# Patient Record
Sex: Male | Born: 1965 | Race: Black or African American | Hispanic: No | Smoking: Former smoker
Health system: Southern US, Community
[De-identification: ages and names within clinical notes are randomized; demographics above are authoritative.]

## PROBLEM LIST (undated history)

## (undated) DIAGNOSIS — Z720 Tobacco use: Secondary | ICD-10-CM

## (undated) DIAGNOSIS — F1290 Cannabis use, unspecified, uncomplicated: Secondary | ICD-10-CM

## (undated) DIAGNOSIS — E785 Hyperlipidemia, unspecified: Secondary | ICD-10-CM

## (undated) DIAGNOSIS — I2510 Atherosclerotic heart disease of native coronary artery without angina pectoris: Secondary | ICD-10-CM

## (undated) DIAGNOSIS — W3400XA Accidental discharge from unspecified firearms or gun, initial encounter: Secondary | ICD-10-CM

## (undated) DIAGNOSIS — I10 Essential (primary) hypertension: Secondary | ICD-10-CM

## (undated) DIAGNOSIS — Y249XXA Unspecified firearm discharge, undetermined intent, initial encounter: Secondary | ICD-10-CM

## (undated) DIAGNOSIS — R569 Unspecified convulsions: Secondary | ICD-10-CM

## (undated) HISTORY — PX: OTHER SURGICAL HISTORY: SHX169

## (undated) HISTORY — PX: WISDOM TOOTH EXTRACTION: SHX21

---

## 1998-01-31 ENCOUNTER — Encounter: Payer: Self-pay | Admitting: Emergency Medicine

## 1998-01-31 ENCOUNTER — Emergency Department (HOSPITAL_COMMUNITY): Admission: EM | Admit: 1998-01-31 | Discharge: 1998-01-31 | Payer: Self-pay | Admitting: Emergency Medicine

## 2000-06-29 ENCOUNTER — Emergency Department (HOSPITAL_COMMUNITY): Admission: EM | Admit: 2000-06-29 | Discharge: 2000-06-29 | Payer: Self-pay | Admitting: Emergency Medicine

## 2000-06-29 ENCOUNTER — Encounter: Payer: Self-pay | Admitting: Emergency Medicine

## 2000-08-18 ENCOUNTER — Emergency Department (HOSPITAL_COMMUNITY): Admission: EM | Admit: 2000-08-18 | Discharge: 2000-08-18 | Payer: Self-pay | Admitting: *Deleted

## 2004-11-06 ENCOUNTER — Emergency Department (HOSPITAL_COMMUNITY): Admission: EM | Admit: 2004-11-06 | Discharge: 2004-11-06 | Payer: Self-pay | Admitting: Emergency Medicine

## 2005-08-29 ENCOUNTER — Emergency Department (HOSPITAL_COMMUNITY): Admission: EM | Admit: 2005-08-29 | Discharge: 2005-08-30 | Payer: Self-pay | Admitting: Emergency Medicine

## 2005-08-31 ENCOUNTER — Emergency Department (HOSPITAL_COMMUNITY): Admission: EM | Admit: 2005-08-31 | Discharge: 2005-08-31 | Payer: Self-pay | Admitting: Emergency Medicine

## 2008-01-11 ENCOUNTER — Ambulatory Visit: Payer: Self-pay | Admitting: Cardiothoracic Surgery

## 2008-01-11 ENCOUNTER — Inpatient Hospital Stay (HOSPITAL_COMMUNITY): Admission: AC | Admit: 2008-01-11 | Discharge: 2008-01-13 | Payer: Self-pay

## 2008-01-12 ENCOUNTER — Ambulatory Visit: Payer: Self-pay | Admitting: Cardiovascular Disease

## 2008-01-12 ENCOUNTER — Encounter: Payer: Self-pay | Admitting: Cardiothoracic Surgery

## 2008-03-21 ENCOUNTER — Ambulatory Visit: Payer: Self-pay | Admitting: Cardiothoracic Surgery

## 2008-03-21 ENCOUNTER — Encounter: Admission: RE | Admit: 2008-03-21 | Discharge: 2008-03-21 | Payer: Self-pay | Admitting: Cardiothoracic Surgery

## 2010-05-05 ENCOUNTER — Encounter: Payer: Self-pay | Admitting: Cardiothoracic Surgery

## 2010-08-08 DIAGNOSIS — Z0271 Encounter for disability determination: Secondary | ICD-10-CM
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
## 2011-01-13 LAB — TYPE AND SCREEN
ABO/RH(D): B POS
Antibody Screen: NEGATIVE

## 2011-01-13 LAB — BASIC METABOLIC PANEL
BUN: 5 — ABNORMAL LOW
CO2: 25
Calcium: 8.9
Chloride: 107
Creatinine, Ser: 0.88
GFR calc Af Amer: >60
GFR calc non Af Amer: >60
Glucose, Bld: 120 — ABNORMAL HIGH
Potassium: 3.8
Sodium: 139

## 2011-01-13 LAB — CBC
HCT: 36.5 — ABNORMAL LOW
HCT: 36.5 — ABNORMAL LOW
Hemoglobin: 12.6 — ABNORMAL LOW
Hemoglobin: 12.7 — ABNORMAL LOW
MCHC: 34.5
MCHC: 34.7
MCV: 88.3
MCV: 88.4
Platelets: 123 — ABNORMAL LOW
RBC: 4.13 — ABNORMAL LOW
RBC: 4.13 — ABNORMAL LOW
RDW: 13.1
RDW: 13.2
WBC: 13.5 — ABNORMAL HIGH
WBC: 14.9 — ABNORMAL HIGH

## 2011-01-13 LAB — ABO/RH: ABO/RH(D): B POS

## 2011-01-13 LAB — POCT I-STAT, CHEM 8
BUN: 9
Calcium, Ion: 1.14
Chloride: 107
Creatinine, Ser: 1.1
Glucose, Bld: 98
HCT: 37 — ABNORMAL LOW
Hemoglobin: 12.6 — ABNORMAL LOW
Potassium: 3.5
Sodium: 139
TCO2: 22

## 2011-01-13 LAB — CARDIAC PANEL(CRET KIN+CKTOT+MB+TROPI)
CK, MB: 3.7
Relative Index: 0.5
Total CK: 769 — ABNORMAL HIGH
Troponin I: 0.01

## 2011-01-13 LAB — PROTIME-INR
INR: 1
Prothrombin Time: 13.7

## 2013-12-28 ENCOUNTER — Emergency Department (HOSPITAL_COMMUNITY): Payer: Self-pay

## 2013-12-28 ENCOUNTER — Emergency Department (HOSPITAL_COMMUNITY)
Admission: EM | Admit: 2013-12-28 | Discharge: 2013-12-28 | Disposition: A | Discharge status: Home / Self Care | Payer: Self-pay | Attending: Emergency Medicine | Admitting: Emergency Medicine

## 2013-12-28 ENCOUNTER — Encounter (HOSPITAL_COMMUNITY): Payer: Self-pay | Admitting: Emergency Medicine

## 2013-12-28 DIAGNOSIS — R071 Chest pain on breathing: Secondary | ICD-10-CM | POA: Insufficient documentation

## 2013-12-28 DIAGNOSIS — I10 Essential (primary) hypertension: Secondary | ICD-10-CM | POA: Insufficient documentation

## 2013-12-28 DIAGNOSIS — R079 Chest pain, unspecified: Secondary | ICD-10-CM | POA: Insufficient documentation

## 2013-12-28 DIAGNOSIS — F17200 Nicotine dependence, unspecified, uncomplicated: Secondary | ICD-10-CM | POA: Insufficient documentation

## 2013-12-28 DIAGNOSIS — Z791 Long term (current) use of non-steroidal anti-inflammatories (NSAID): Secondary | ICD-10-CM | POA: Insufficient documentation

## 2013-12-28 DIAGNOSIS — R209 Unspecified disturbances of skin sensation: Secondary | ICD-10-CM | POA: Insufficient documentation

## 2013-12-28 HISTORY — DX: Essential (primary) hypertension: I10

## 2013-12-28 LAB — I-STAT TROPONIN, ED: Troponin i, poc: 0 ng/mL (ref 0.00–0.08)

## 2013-12-28 LAB — COMPREHENSIVE METABOLIC PANEL
ALT: 15 U/L (ref 0–53)
AST: 22 U/L (ref 0–37)
Albumin: 3.7 g/dL (ref 3.5–5.2)
Alkaline Phosphatase: 86 U/L (ref 39–117)
Anion gap: 12 (ref 5–15)
BUN: 15 mg/dL (ref 6–23)
CO2: 23 mEq/L (ref 19–32)
Calcium: 9.4 mg/dL (ref 8.4–10.5)
Chloride: 105 mEq/L (ref 96–112)
Creatinine, Ser: 0.99 mg/dL (ref 0.50–1.35)
GFR calc Af Amer: >90 mL/min (ref >90)
GFR calc non Af Amer: >90 mL/min (ref >90)
Glucose, Bld: 104 mg/dL — ABNORMAL HIGH (ref 70–99)
Potassium: 4.1 mEq/L (ref 3.7–5.3)
Sodium: 140 mEq/L (ref 137–147)
Total Bilirubin: 0.7 mg/dL (ref 0.3–1.2)
Total Protein: 7.4 g/dL (ref 6.0–8.3)

## 2013-12-28 LAB — URINALYSIS, ROUTINE W REFLEX MICROSCOPIC
Bilirubin Urine: NEGATIVE
Glucose, UA: NEGATIVE mg/dL
Hgb urine dipstick: NEGATIVE
Ketones, ur: NEGATIVE mg/dL
Leukocytes, UA: NEGATIVE
Nitrite: NEGATIVE
Protein, ur: NEGATIVE mg/dL
Specific Gravity, Urine: 1.014 (ref 1.005–1.030)
Urobilinogen, UA: 1 mg/dL (ref 0.0–1.0)
pH: 7 (ref 5.0–8.0)

## 2013-12-28 LAB — CBC
HCT: 37.7 % — ABNORMAL LOW (ref 39.0–52.0)
Hemoglobin: 13.1 g/dL (ref 13.0–17.0)
MCH: 30.7 pg (ref 26.0–34.0)
MCHC: 34.7 g/dL (ref 30.0–36.0)
MCV: 88.3 fL (ref 78.0–100.0)
Platelets: ADEQUATE 10*3/uL (ref 150–400)
RBC: 4.27 MIL/uL (ref 4.22–5.81)
RDW: 12.9 % (ref 11.5–15.5)
WBC: 10.6 10*3/uL — ABNORMAL HIGH (ref 4.0–10.5)

## 2013-12-28 MED ORDER — NITROGLYCERIN 0.4 MG SL SUBL
0.4000 mg | SUBLINGUAL_TABLET | Freq: Once | SUBLINGUAL | Status: AC
  Administered 2013-12-28: 0.4 mg via SUBLINGUAL
  Filled 2013-12-28: qty 1

## 2013-12-28 MED ORDER — ACETAMINOPHEN 325 MG PO TABS
650.0000 mg | ORAL_TABLET | Freq: Once | ORAL | Status: AC
  Administered 2013-12-28: 650 mg via ORAL
  Filled 2013-12-28: qty 2

## 2013-12-28 MED ORDER — AMLODIPINE BESYLATE 5 MG PO TABS: 5.0000 mg | ORAL_TABLET | Freq: Every day | ORAL | Status: DC

## 2013-12-28 NOTE — ED Notes (Signed)
Pt states he has been having chest pain off and on for about 3-5 days. Pt states he was at work working this am when chest pain started. Pt works as a Psychologist, occupational. Pt states he was waiting for his insurance to kick in before he came in to see the doctor. Pt states when chest pain starts if feels like a flutter sensation and he also c/o sob n/v
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
## 2013-12-28 NOTE — Discharge Instructions (Signed)
Chest Pain (Nonspecific) °It is often hard to give a specific diagnosis for the cause of chest pain. There is always a chance that your pain could be related to something serious, such as a heart attack or a blood clot in the lungs. You need to follow up with your health care provider for further evaluation. °CAUSES  °· Heartburn. °· Pneumonia or bronchitis. °· Anxiety or stress. °· Inflammation around your heart (pericarditis) or lung (pleuritis or pleurisy). °· A blood clot in the lung. °· A collapsed lung (pneumothorax). It can develop suddenly on its own (spontaneous pneumothorax) or from trauma to the chest. °· Shingles infection (herpes zoster virus). °The chest wall is composed of bones, muscles, and cartilage. Any of these can be the source of the pain. °· The bones can be bruised by injury. °· The muscles or cartilage can be strained by coughing or overwork. °· The cartilage can be affected by inflammation and become sore (costochondritis). °DIAGNOSIS  °Lab tests or other studies may be needed to find the cause of your pain. Your health care provider may have you take a test called an ambulatory electrocardiogram (ECG). An ECG records your heartbeat patterns over a 24-hour period. You may also have other tests, such as: °· Transthoracic echocardiogram (TTE). During echocardiography, sound waves are used to evaluate how blood flows through your heart. °· Transesophageal echocardiogram (TEE). °· Cardiac monitoring. This allows your health care provider to monitor your heart rate and rhythm in real time. °· Holter monitor. This is a portable device that records your heartbeat and can help diagnose heart arrhythmias. It allows your health care provider to track your heart activity for several days, if needed. °· Stress tests by exercise or by giving medicine that makes the heart beat faster. °TREATMENT  °· Treatment depends on what may be causing your chest pain. Treatment may include: °· Acid blockers for  heartburn. °· Anti-inflammatory medicine. °· Pain medicine for inflammatory conditions. °· Antibiotics if an infection is present. °· You may be advised to change lifestyle habits. This includes stopping smoking and avoiding alcohol, caffeine, and chocolate. °· You may be advised to keep your head raised (elevated) when sleeping. This reduces the chance of acid going backward from your stomach into your esophagus. °Most of the time, nonspecific chest pain will improve within 2-3 days with rest and mild pain medicine.  °HOME CARE INSTRUCTIONS  °· If antibiotics were prescribed, take them as directed. Finish them even if you start to feel better. °· For the next few days, avoid physical activities that bring on chest pain. Continue physical activities as directed. °· Do not use any tobacco products, including cigarettes, chewing tobacco, or electronic cigarettes. °· Avoid drinking alcohol. °· Only take medicine as directed by your health care provider. °· Follow your health care provider's suggestions for further testing if your chest pain does not go away. °· Keep any follow-up appointments you made. If you do not go to an appointment, you could develop lasting (chronic) problems with pain. If there is any problem keeping an appointment, call to reschedule. °SEEK MEDICAL CARE IF:  °· Your chest pain does not go away, even after treatment. °· You have a rash with blisters on your chest. °· You have a fever. °SEEK IMMEDIATE MEDICAL CARE IF:  °· You have increased chest pain or pain that spreads to your arm, neck, jaw, back, or abdomen. °· You have shortness of breath. °· You have an increasing cough, or you cough   up blood. °· You have severe back or abdominal pain. °· You feel nauseous or vomit. °· You have severe weakness. °· You faint. °· You have chills. °This is an emergency. Do not wait to see if the pain will go away. Get medical help at once. Call your local emergency services (911 in [place] Do not drive  yourself to the hospital. °MAKE SURE YOU:  °· Understand these instructions. °· Will watch your condition. °· Will get help right away if you are not doing well or get worse. °Document Released: 01/08/2005 Document Revised: 04/05/2013 Document Reviewed: 11/04/2007 °ExitCare® Patient Information ©2015 [employer]. This information is not intended to replace advice given to you by your health care provider. Make sure you discuss any questions you have with your health care provider. ° °Hypertension °Hypertension, commonly called high blood pressure, is when the force of blood pumping through your arteries is too strong. Your arteries are the blood vessels that carry blood from your heart throughout your body. A blood pressure reading consists of a higher number over a lower number, such as 110/72. The higher number (systolic) is the pressure inside your arteries when your heart pumps. The lower number (diastolic) is the pressure inside your arteries when your heart relaxes. Ideally you want your blood pressure below 120/80. °Hypertension forces your heart to work harder to pump blood. Your arteries may become narrow or stiff. Having hypertension puts you at risk for heart disease, stroke, and other problems.  °RISK FACTORS °Some risk factors for high blood pressure are controllable. Others are not.  °Risk factors you cannot control include:  °· Race. You may be at higher risk if you are African American. °· Age. Risk increases with age. °· Gender. Men are at higher risk than women before age 45 years. After age 65, women are at higher risk than men. °Risk factors you can control include: °· Not getting enough exercise or physical activity. °· Being overweight. °· Getting too much fat, sugar, calories, or salt in your diet. °· Drinking too much alcohol. °SIGNS AND SYMPTOMS °Hypertension does not usually cause signs or symptoms. Extremely high blood pressure (hypertensive crisis) may cause headache, anxiety, shortness  of breath, and nosebleed. °DIAGNOSIS  °To check if you have hypertension, your health care provider will measure your blood pressure while you are seated, with your arm held at the level of your heart. It should be measured at least twice using the same arm. Certain conditions can cause a difference in blood pressure between your right and left arms. A blood pressure reading that is higher than normal on one occasion does not mean that you need treatment. If one blood pressure reading is high, ask your health care provider about having it checked again. °TREATMENT  °Treating high blood pressure includes making lifestyle changes and possibly taking medicine. Living a healthy lifestyle can help lower high blood pressure. You may need to change some of your habits. °Lifestyle changes may include: °· Following the DASH diet. This diet is high in fruits, vegetables, and whole grains. It is low in salt, red meat, and added sugars. °· Getting at least 2½ hours of brisk physical activity every week. °· Losing weight if necessary. °· Not smoking. °· Limiting alcoholic beverages. °· Learning ways to reduce stress. ° If lifestyle changes are not enough to get your blood pressure under control, your health care provider may prescribe medicine. You may need to take more than one. Work closely with your health care provider   to understand the risks and benefits. °HOME CARE INSTRUCTIONS °· Have your blood pressure rechecked as directed by your health care provider.   °· Take medicines only as directed by your health care provider. Follow the directions carefully. Blood pressure medicines must be taken as prescribed. The medicine does not work as well when you skip doses. Skipping doses also puts you at risk for problems.   °· Do not smoke.   °· Monitor your blood pressure at home as directed by your health care provider.  °SEEK MEDICAL CARE IF:  °· You think you are having a reaction to medicines taken. °· You have recurrent  headaches or feel dizzy. °· You have swelling in your ankles. °· You have trouble with your vision. °SEEK IMMEDIATE MEDICAL CARE IF: °· You develop a severe headache or confusion. °· You have unusual weakness, numbness, or feel faint. °· You have severe chest or abdominal pain. °· You vomit repeatedly. °· You have trouble breathing. °MAKE SURE YOU:  °· Understand these instructions. °· Will watch your condition. °· Will get help right away if you are not doing well or get worse. °Document Released: 03/31/2005 Document Revised: 08/15/2013 Document Reviewed: 01/21/2013 °ExitCare® Patient Information ©2015 [employer]. This information is not intended to replace advice given to you by your health care provider. Make sure you discuss any questions you have with your health care provider. ° °

## 2013-12-31 ENCOUNTER — Emergency Department (HOSPITAL_COMMUNITY): Payer: Self-pay

## 2013-12-31 ENCOUNTER — Encounter (HOSPITAL_COMMUNITY): Payer: Self-pay | Admitting: Emergency Medicine

## 2013-12-31 ENCOUNTER — Emergency Department (HOSPITAL_COMMUNITY)
Admission: EM | Admit: 2013-12-31 | Discharge: 2014-01-01 | Disposition: A | Discharge status: Home / Self Care | Payer: Self-pay | Attending: Emergency Medicine | Admitting: Emergency Medicine

## 2013-12-31 DIAGNOSIS — R059 Cough, unspecified: Secondary | ICD-10-CM | POA: Insufficient documentation

## 2013-12-31 DIAGNOSIS — I10 Essential (primary) hypertension: Secondary | ICD-10-CM | POA: Insufficient documentation

## 2013-12-31 DIAGNOSIS — R0789 Other chest pain: Secondary | ICD-10-CM

## 2013-12-31 DIAGNOSIS — R079 Chest pain, unspecified: Secondary | ICD-10-CM | POA: Insufficient documentation

## 2013-12-31 DIAGNOSIS — R05 Cough: Secondary | ICD-10-CM | POA: Insufficient documentation

## 2013-12-31 DIAGNOSIS — Z87891 Personal history of nicotine dependence: Secondary | ICD-10-CM | POA: Insufficient documentation

## 2013-12-31 DIAGNOSIS — Z79899 Other long term (current) drug therapy: Secondary | ICD-10-CM | POA: Insufficient documentation

## 2013-12-31 DIAGNOSIS — Z87828 Personal history of other (healed) physical injury and trauma: Secondary | ICD-10-CM | POA: Insufficient documentation

## 2013-12-31 HISTORY — DX: Accidental discharge from unspecified firearms or gun, initial encounter: W34.00XA

## 2013-12-31 HISTORY — DX: Unspecified firearm discharge, undetermined intent, initial encounter: Y24.9XXA

## 2013-12-31 LAB — BASIC METABOLIC PANEL
Anion gap: 11 (ref 5–15)
BUN: 19 mg/dL (ref 6–23)
CO2: 29 mEq/L (ref 19–32)
Calcium: 9.5 mg/dL (ref 8.4–10.5)
Chloride: 102 mEq/L (ref 96–112)
Creatinine, Ser: 1 mg/dL (ref 0.50–1.35)
GFR calc Af Amer: >90 mL/min (ref >90)
GFR calc non Af Amer: 87 mL/min — ABNORMAL LOW (ref >90)
Glucose, Bld: 104 mg/dL — ABNORMAL HIGH (ref 70–99)
Potassium: 4.1 mEq/L (ref 3.7–5.3)
Sodium: 142 mEq/L (ref 137–147)

## 2013-12-31 LAB — CBC
HCT: 38.2 % — ABNORMAL LOW (ref 39.0–52.0)
Hemoglobin: 13.3 g/dL (ref 13.0–17.0)
MCH: 30.6 pg (ref 26.0–34.0)
MCHC: 34.8 g/dL (ref 30.0–36.0)
MCV: 87.8 fL (ref 78.0–100.0)
RBC: 4.35 MIL/uL (ref 4.22–5.81)
RDW: 12.9 % (ref 11.5–15.5)
WBC: 9.4 10*3/uL (ref 4.0–10.5)

## 2013-12-31 LAB — I-STAT TROPONIN, ED: Troponin i, poc: 0 ng/mL (ref 0.00–0.08)

## 2013-12-31 LAB — PRO B NATRIURETIC PEPTIDE: Pro B Natriuretic peptide (BNP): 12.2 pg/mL (ref 0–125)

## 2013-12-31 NOTE — ED Notes (Signed)
Pt. reports intermittent mid/left chest pain with SOB and productive cough onset last week , denies nausea or diaphoresis .

## 2014-01-01 MED ORDER — KETOROLAC TROMETHAMINE 30 MG/ML IJ SOLN
30.0000 mg | Freq: Once | INTRAMUSCULAR | Status: AC
  Administered 2014-01-01: 30 mg via INTRAVENOUS
  Filled 2014-01-01: qty 1

## 2014-01-01 MED ORDER — OXYCODONE-ACETAMINOPHEN 5-325 MG PO TABS: 1.0000 | ORAL_TABLET | ORAL | Status: DC | PRN

## 2014-01-01 MED ORDER — HYDROCODONE-ACETAMINOPHEN 5-325 MG PO TABS
2.0000 | ORAL_TABLET | Freq: Once | ORAL | Status: AC
  Administered 2014-01-01: 2 via ORAL
  Filled 2014-01-01: qty 2
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
## 2014-01-06 ENCOUNTER — Other Ambulatory Visit: Payer: Self-pay | Admitting: *Deleted

## 2014-01-06 DIAGNOSIS — R0789 Other chest pain: Secondary | ICD-10-CM

## 2014-01-08 ENCOUNTER — Emergency Department (HOSPITAL_COMMUNITY): Payer: Self-pay

## 2014-01-08 ENCOUNTER — Observation Stay (HOSPITAL_COMMUNITY)
Admission: EM | Admit: 2014-01-08 | Discharge: 2014-01-09 | Disposition: A | Discharge status: Home / Self Care | Payer: Self-pay | Attending: Internal Medicine | Admitting: Internal Medicine

## 2014-01-08 ENCOUNTER — Encounter (HOSPITAL_COMMUNITY): Payer: Self-pay | Admitting: Emergency Medicine

## 2014-01-08 DIAGNOSIS — Z87891 Personal history of nicotine dependence: Secondary | ICD-10-CM | POA: Insufficient documentation

## 2014-01-08 DIAGNOSIS — Z79899 Other long term (current) drug therapy: Secondary | ICD-10-CM | POA: Insufficient documentation

## 2014-01-08 DIAGNOSIS — F1410 Cocaine abuse, uncomplicated: Secondary | ICD-10-CM | POA: Diagnosis present

## 2014-01-08 DIAGNOSIS — I10 Essential (primary) hypertension: Secondary | ICD-10-CM | POA: Diagnosis present

## 2014-01-08 DIAGNOSIS — R072 Precordial pain: Secondary | ICD-10-CM

## 2014-01-08 DIAGNOSIS — R079 Chest pain, unspecified: Secondary | ICD-10-CM | POA: Diagnosis present

## 2014-01-08 DIAGNOSIS — D72829 Elevated white blood cell count, unspecified: Secondary | ICD-10-CM | POA: Diagnosis present

## 2014-01-08 DIAGNOSIS — F1210 Cannabis abuse, uncomplicated: Secondary | ICD-10-CM | POA: Insufficient documentation

## 2014-01-08 DIAGNOSIS — R0789 Other chest pain: Principal | ICD-10-CM | POA: Insufficient documentation

## 2014-01-08 LAB — RAPID URINE DRUG SCREEN, HOSP PERFORMED
Amphetamines: NOT DETECTED
Barbiturates: NOT DETECTED
Benzodiazepines: NOT DETECTED
Cocaine: POSITIVE — AB
Opiates: NOT DETECTED
Tetrahydrocannabinol: NOT DETECTED

## 2014-01-08 LAB — CBC WITH DIFFERENTIAL/PLATELET
Basophils Absolute: 0 10*3/uL (ref 0.0–0.1)
Basophils Relative: 0 % (ref 0–1)
Eosinophils Absolute: 0.3 10*3/uL (ref 0.0–0.7)
Eosinophils Relative: 2 % (ref 0–5)
HCT: 40.9 % (ref 39.0–52.0)
Hemoglobin: 14.6 g/dL (ref 13.0–17.0)
Lymphocytes Relative: 22 % (ref 12–46)
Lymphs Abs: 2.8 10*3/uL (ref 0.7–4.0)
MCH: 31.5 pg (ref 26.0–34.0)
MCHC: 35.7 g/dL (ref 30.0–36.0)
MCV: 88.1 fL (ref 78.0–100.0)
Monocytes Absolute: 0.6 10*3/uL (ref 0.1–1.0)
Monocytes Relative: 5 % (ref 3–12)
Neutro Abs: 8.8 10*3/uL — ABNORMAL HIGH (ref 1.7–7.7)
Neutrophils Relative %: 71 % (ref 43–77)
Platelets: ADEQUATE 10*3/uL (ref 150–400)
RBC: 4.64 MIL/uL (ref 4.22–5.81)
RDW: 12.7 % (ref 11.5–15.5)
WBC: 12.5 10*3/uL — ABNORMAL HIGH (ref 4.0–10.5)

## 2014-01-08 LAB — COMPREHENSIVE METABOLIC PANEL
ALT: 21 U/L (ref 0–53)
AST: 27 U/L (ref 0–37)
Albumin: 3.8 g/dL (ref 3.5–5.2)
Alkaline Phosphatase: 93 U/L (ref 39–117)
Anion gap: 16 — ABNORMAL HIGH (ref 5–15)
BUN: 17 mg/dL (ref 6–23)
CO2: 22 mEq/L (ref 19–32)
Calcium: 9 mg/dL (ref 8.4–10.5)
Chloride: 101 mEq/L (ref 96–112)
Creatinine, Ser: 0.73 mg/dL (ref 0.50–1.35)
GFR calc Af Amer: >90 mL/min (ref >90)
GFR calc non Af Amer: >90 mL/min (ref >90)
Glucose, Bld: 118 mg/dL — ABNORMAL HIGH (ref 70–99)
Potassium: 4.3 mEq/L (ref 3.7–5.3)
Sodium: 139 mEq/L (ref 137–147)
Total Bilirubin: 0.7 mg/dL (ref 0.3–1.2)
Total Protein: 7.6 g/dL (ref 6.0–8.3)

## 2014-01-08 LAB — TROPONIN I
Troponin I: <0.3 ng/mL (ref <0.30)
Troponin I: <0.3 ng/mL (ref <0.30)

## 2014-01-08 LAB — URINALYSIS, ROUTINE W REFLEX MICROSCOPIC
Bilirubin Urine: NEGATIVE
Glucose, UA: NEGATIVE mg/dL
Hgb urine dipstick: NEGATIVE
Ketones, ur: NEGATIVE mg/dL
Leukocytes, UA: NEGATIVE
Nitrite: NEGATIVE
Protein, ur: NEGATIVE mg/dL
Specific Gravity, Urine: 1.015 (ref 1.005–1.030)
Urobilinogen, UA: 0.2 mg/dL (ref 0.0–1.0)
pH: 5 (ref 5.0–8.0)

## 2014-01-08 LAB — I-STAT TROPONIN, ED
Troponin i, poc: 0 ng/mL (ref 0.00–0.08)
Troponin i, poc: 0 ng/mL (ref 0.00–0.08)

## 2014-01-08 LAB — LIPASE, BLOOD: Lipase: 86 U/L — ABNORMAL HIGH (ref 11–59)

## 2014-01-08 LAB — D-DIMER, QUANTITATIVE: D-Dimer, Quant: <0.27 ug/mL-FEU (ref 0.00–0.48)

## 2014-01-08 MED ORDER — ACETAMINOPHEN 325 MG PO TABS: 650.0000 mg | ORAL_TABLET | Freq: Four times a day (QID) | ORAL | Status: DC | PRN

## 2014-01-08 MED ORDER — OXYCODONE-ACETAMINOPHEN 5-325 MG PO TABS
1.0000 | ORAL_TABLET | ORAL | Status: DC | PRN
  Administered 2014-01-08 – 2014-01-09 (×3): 1 via ORAL
  Filled 2014-01-08 (×3): qty 1

## 2014-01-08 MED ORDER — SODIUM CHLORIDE 0.9 % IV SOLN
INTRAVENOUS | Status: DC
  Administered 2014-01-09: 10:00:00 via INTRAVENOUS

## 2014-01-08 MED ORDER — FLUOXETINE HCL 20 MG PO CAPS
20.0000 mg | ORAL_CAPSULE | Freq: Every day | ORAL | Status: DC
  Administered 2014-01-08 – 2014-01-09 (×2): 20 mg via ORAL
  Filled 2014-01-08 (×2): qty 1

## 2014-01-08 MED ORDER — LISINOPRIL-HYDROCHLOROTHIAZIDE 20-12.5 MG PO TABS: 1.0000 | ORAL_TABLET | Freq: Every day | ORAL | Status: DC

## 2014-01-08 MED ORDER — HYDROCHLOROTHIAZIDE 12.5 MG PO CAPS
12.5000 mg | ORAL_CAPSULE | Freq: Every day | ORAL | Status: DC
  Administered 2014-01-08: 12.5 mg via ORAL
  Filled 2014-01-08 (×3): qty 1

## 2014-01-08 MED ORDER — ASPIRIN EC 325 MG PO TBEC
325.0000 mg | DELAYED_RELEASE_TABLET | ORAL | Status: AC
  Administered 2014-01-08: 325 mg via ORAL
  Filled 2014-01-08: qty 1

## 2014-01-08 MED ORDER — ENOXAPARIN SODIUM 40 MG/0.4ML ~~LOC~~ SOLN
40.0000 mg | SUBCUTANEOUS | Status: DC
  Administered 2014-01-08: 40 mg via SUBCUTANEOUS
  Filled 2014-01-08 (×2): qty 0.4

## 2014-01-08 MED ORDER — AMLODIPINE BESYLATE 5 MG PO TABS
5.0000 mg | ORAL_TABLET | Freq: Every day | ORAL | Status: DC
  Administered 2014-01-08 – 2014-01-09 (×2): 5 mg via ORAL
  Filled 2014-01-08 (×2): qty 1

## 2014-01-08 MED ORDER — ACETAMINOPHEN 650 MG RE SUPP: 650.0000 mg | Freq: Four times a day (QID) | RECTAL | Status: DC | PRN

## 2014-01-08 MED ORDER — HYDROMORPHONE HCL 1 MG/ML IJ SOLN
1.0000 mg | Freq: Once | INTRAMUSCULAR | Status: AC
  Administered 2014-01-08: 1 mg via INTRAVENOUS
  Filled 2014-01-08: qty 1

## 2014-01-08 MED ORDER — NITROGLYCERIN 0.4 MG SL SUBL
0.4000 mg | SUBLINGUAL_TABLET | SUBLINGUAL | Status: DC | PRN
  Administered 2014-01-08 (×3): 0.4 mg via SUBLINGUAL
  Filled 2014-01-08: qty 1

## 2014-01-08 MED ORDER — HYDRALAZINE HCL 20 MG/ML IJ SOLN: 10.0000 mg | Freq: Four times a day (QID) | INTRAMUSCULAR | Status: DC | PRN

## 2014-01-08 MED ORDER — MORPHINE SULFATE 2 MG/ML IJ SOLN: 1.0000 mg | INTRAMUSCULAR | Status: DC | PRN

## 2014-01-08 MED ORDER — SODIUM CHLORIDE 0.9 % IJ SOLN
3.0000 mL | Freq: Two times a day (BID) | INTRAMUSCULAR | Status: DC
  Administered 2014-01-08: 3 mL via INTRAVENOUS

## 2014-01-08 MED ORDER — PANTOPRAZOLE SODIUM 40 MG IV SOLR
40.0000 mg | Freq: Two times a day (BID) | INTRAVENOUS | Status: DC
  Administered 2014-01-08 – 2014-01-09 (×2): 40 mg via INTRAVENOUS
  Filled 2014-01-08 (×3): qty 40

## 2014-01-08 MED ORDER — IOHEXOL 300 MG/ML  SOLN
100.0000 mL | Freq: Once | INTRAMUSCULAR | Status: AC | PRN
  Administered 2014-01-08: 100 mL via INTRAVENOUS

## 2014-01-08 MED ORDER — ALUM & MAG HYDROXIDE-SIMETH 200-200-20 MG/5ML PO SUSP: 30.0000 mL | Freq: Four times a day (QID) | ORAL | Status: DC | PRN

## 2014-01-08 MED ORDER — IOHEXOL 300 MG/ML  SOLN
50.0000 mL | Freq: Once | INTRAMUSCULAR | Status: AC | PRN
  Administered 2014-01-08: 50 mL via ORAL

## 2014-01-08 MED ORDER — LISINOPRIL 20 MG PO TABS
20.0000 mg | ORAL_TABLET | Freq: Every day | ORAL | Status: DC
  Administered 2014-01-08 – 2014-01-09 (×2): 20 mg via ORAL
  Filled 2014-01-08 (×2): qty 1

## 2014-01-08 MED ORDER — ASPIRIN EC 81 MG PO TBEC
81.0000 mg | DELAYED_RELEASE_TABLET | Freq: Every day | ORAL | Status: DC | PRN
  Filled 2014-01-08: qty 1

## 2014-01-08 NOTE — ED Notes (Signed)
Pt to xray

## 2014-01-08 NOTE — ED Notes (Signed)
Report given to Mona, RN.
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
## 2014-01-08 NOTE — ED Notes (Signed)
Ambulated to the restroom.  No assistance needed, gait was steady.
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
## 2014-01-09 DIAGNOSIS — R072 Precordial pain: Secondary | ICD-10-CM

## 2014-01-09 DIAGNOSIS — F1410 Cocaine abuse, uncomplicated: Secondary | ICD-10-CM | POA: Diagnosis present

## 2014-01-09 LAB — BASIC METABOLIC PANEL
Anion gap: 12 (ref 5–15)
BUN: 12 mg/dL (ref 6–23)
CO2: 25 mEq/L (ref 19–32)
Calcium: 9.5 mg/dL (ref 8.4–10.5)
Chloride: 99 mEq/L (ref 96–112)
Creatinine, Ser: 0.89 mg/dL (ref 0.50–1.35)
GFR calc Af Amer: >90 mL/min (ref >90)
GFR calc non Af Amer: >90 mL/min (ref >90)
Glucose, Bld: 93 mg/dL (ref 70–99)
Potassium: 4 mEq/L (ref 3.7–5.3)
Sodium: 136 mEq/L — ABNORMAL LOW (ref 137–147)

## 2014-01-09 LAB — TROPONIN I: Troponin I: <0.3 ng/mL (ref <0.30)

## 2014-01-09 LAB — CBC
HCT: 42.2 % (ref 39.0–52.0)
Hemoglobin: 14.7 g/dL (ref 13.0–17.0)
MCH: 30.7 pg (ref 26.0–34.0)
MCHC: 34.8 g/dL (ref 30.0–36.0)
MCV: 88.1 fL (ref 78.0–100.0)
Platelets: UNDETERMINED 10*3/uL (ref 150–400)
RBC: 4.79 MIL/uL (ref 4.22–5.81)
RDW: 12.6 % (ref 11.5–15.5)
WBC: 9.7 10*3/uL (ref 4.0–10.5)

## 2014-01-09 LAB — LIPID PANEL
Cholesterol: 174 mg/dL (ref 0–200)
HDL: 68 mg/dL (ref >39)
LDL Cholesterol: 87 mg/dL (ref 0–99)
Total CHOL/HDL Ratio: 2.6 RATIO
Triglycerides: 93 mg/dL (ref <150)
VLDL: 19 mg/dL (ref 0–40)

## 2014-01-09 LAB — VITAMIN B12: Vitamin B-12: 1115 pg/mL — ABNORMAL HIGH (ref 211–911)

## 2014-01-09 LAB — LIPASE, BLOOD: Lipase: 57 U/L (ref 11–59)

## 2014-01-09 NOTE — Progress Notes (Signed)
UR completed 

## 2014-01-09 NOTE — Discharge Instructions (Addendum)
Chest Pain (Nonspecific) It is often hard to give a diagnosis for the cause of chest pain. There is always a chance that your pain could be related to something serious, such as a heart attack or a blood clot in the lungs. You need to follow up with your doctor. HOME CARE  If antibiotic medicine was given, take it as directed by your doctor. Finish the medicine even if you start to feel better.  For the next few days, avoid activities that bring on chest pain. Continue physical activities as told by your doctor.  Do not use any tobacco products. This includes cigarettes, chewing tobacco, and e-cigarettes.  Avoid drinking alcohol.  Only take medicine as told by your doctor.  Follow your doctor's suggestions for more testing if your chest pain does not go away.  Keep all doctor visits you made. GET HELP IF:  Your chest pain does not go away, even after treatment.  You have a rash with blisters on your chest.  You have a fever. GET HELP RIGHT AWAY IF:   You have more pain or pain that spreads to your arm, neck, jaw, back, or belly (abdomen).  You have shortness of breath.  You cough more than usual or cough up blood.  You have very bad back or belly pain.  You feel sick to your stomach (nauseous) or throw up (vomit).  You have very bad weakness.  You pass out (faint).  You have chills. This is an emergency. Do not wait to see if the problems will go away. Call your local emergency services (911 in [place] Do not drive yourself to the hospital. MAKE SURE YOU:   Understand these instructions.  Will watch your condition.  Will get help right away if you are not doing well or get worse. Document Released: 09/17/2007 Document Revised: 04/05/2013 Document Reviewed: 09/17/2007 [facility] Patient Information 2015 [place], [place]. This information is not intended to replace advice given to you by your health care provider. Make sure you discuss any questions you have with your  health care provider.    DASH Eating Plan DASH stands for "Dietary Approaches to Stop Hypertension." The DASH eating plan is a healthy eating plan that has been shown to reduce high blood pressure (hypertension). Additional health benefits may include reducing the risk of type 2 diabetes mellitus, heart disease, and stroke. The DASH eating plan may also help with weight loss. WHAT DO I NEED TO KNOW ABOUT THE DASH EATING PLAN? For the DASH eating plan, you will follow these general guidelines:  Choose foods with a percent daily value for sodium of less than 5% (as listed on the food label).  Use salt-free seasonings or herbs instead of table salt or sea salt.  Check with your health care provider or pharmacist before using salt substitutes.  Eat lower-sodium products, often labeled as "lower sodium" or "no salt added."  Eat fresh foods.  Eat more vegetables, fruits, and low-fat dairy products.  Choose whole grains. Look for the word "whole" as the first word in the ingredient list.  Choose fish and skinless chicken or [place] more often than red meat. Limit fish, poultry, and meat to 6 oz (170 g) each day.  Limit sweets, desserts, sugars, and sugary drinks.  Choose heart-healthy fats.  Limit cheese to 1 oz (28 g) per day.  Eat more home-cooked food and less restaurant, buffet, and fast food.  Limit fried foods.  Cook foods using methods other than frying.  Limit canned  vegetables. If you do use them, rinse them well to decrease the sodium.  When eating at a restaurant, ask that your food be prepared with less salt, or no salt if possible. WHAT FOODS CAN I EAT? Seek help from a dietitian for individual calorie needs. Grains Whole grain or whole wheat bread. Brown rice. Whole grain or whole wheat pasta. Quinoa, bulgur, and whole grain cereals. Low-sodium cereals. Corn or whole wheat flour tortillas. Whole grain cornbread. Whole grain crackers. Low-sodium  crackers. Vegetables Fresh or frozen vegetables (raw, steamed, roasted, or grilled). Low-sodium or reduced-sodium tomato and vegetable juices. Low-sodium or reduced-sodium tomato sauce and paste. Low-sodium or reduced-sodium canned vegetables.  Fruits All fresh, canned (in natural juice), or frozen fruits. Meat and Other Protein Products Ground beef (85% or leaner), grass-fed beef, or beef trimmed of fat. Skinless chicken or [place]. Ground chicken or [place]. Pork trimmed of fat. All fish and seafood. Eggs. Dried beans, peas, or lentils. Unsalted nuts and seeds. Unsalted canned beans. Dairy Low-fat dairy products, such as skim or 1% milk, 2% or reduced-fat cheeses, low-fat ricotta or cottage cheese, or plain low-fat yogurt. Low-sodium or reduced-sodium cheeses. Fats and Oils Tub margarines without trans fats. Light or reduced-fat mayonnaise and salad dressings (reduced sodium). Avocado. Safflower, olive, or canola oils. Natural peanut or almond butter. Other Unsalted popcorn and pretzels. The items listed above may not be a complete list of recommended foods or beverages. Contact your dietitian for more options. WHAT FOODS ARE NOT RECOMMENDED? Grains White bread. White pasta. White rice. Refined cornbread. Bagels and croissants. Crackers that contain trans fat. Vegetables Creamed or fried vegetables. Vegetables in a cheese sauce. Regular canned vegetables. Regular canned tomato sauce and paste. Regular tomato and vegetable juices. Fruits Dried fruits. Canned fruit in light or heavy syrup. Fruit juice. Meat and Other Protein Products Fatty cuts of meat. Ribs, chicken wings, bacon, sausage, bologna, salami, chitterlings, fatback, hot dogs, bratwurst, and packaged luncheon meats. Salted nuts and seeds. Canned beans with salt. Dairy Whole or 2% milk, cream, half-and-half, and cream cheese. Whole-fat or sweetened yogurt. Full-fat cheeses or blue cheese. Nondairy creamers and whipped toppings.  Processed cheese, cheese spreads, or cheese curds. Condiments Onion and garlic salt, seasoned salt, table salt, and sea salt. Canned and packaged gravies. Worcestershire sauce. Tartar sauce. Barbecue sauce. Teriyaki sauce. Soy sauce, including reduced sodium. Steak sauce. Fish sauce. Oyster sauce. Cocktail sauce. Horseradish. Ketchup and mustard. Meat flavorings and tenderizers. Bouillon cubes. Hot sauce. Tabasco sauce. Marinades. Taco seasonings. Relishes. Fats and Oils Butter, stick margarine, lard, shortening, ghee, and bacon fat. Coconut, palm kernel, or palm oils. Regular salad dressings. Other Pickles and olives. Salted popcorn and pretzels. The items listed above may not be a complete list of foods and beverages to avoid. Contact your dietitian for more information. WHERE CAN I FIND MORE INFORMATION? National Heart, Lung, and Blood Institute: [employer] Document Released: 03/20/2011 Document Revised: 08/15/2013 Document Reviewed: 02/02/2013 [facility] Patient Information 2015 [place], [place]. This information is not intended to replace advice given to you by your health care provider. Make sure you discuss any questions you have with your health care provider.
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
## 2014-01-11 ENCOUNTER — Inpatient Hospital Stay: Admission: RE | Admit: 2014-01-11 | Payer: Self-pay | Source: Ambulatory Visit

## 2014-01-11 ENCOUNTER — Ambulatory Visit (INDEPENDENT_AMBULATORY_CARE_PROVIDER_SITE_OTHER): Payer: Self-pay | Admitting: Cardiothoracic Surgery

## 2014-01-11 ENCOUNTER — Encounter: Payer: Self-pay | Admitting: Cardiothoracic Surgery

## 2014-01-11 ENCOUNTER — Ambulatory Visit: Payer: Self-pay | Admitting: Cardiothoracic Surgery

## 2014-01-11 ENCOUNTER — Ambulatory Visit
Admission: RE | Admit: 2014-01-11 | Discharge: 2014-01-11 | Disposition: A | Discharge status: Home / Self Care | Payer: Self-pay | Source: Ambulatory Visit | Attending: Cardiothoracic Surgery | Admitting: Cardiothoracic Surgery

## 2014-01-11 ENCOUNTER — Other Ambulatory Visit: Payer: Self-pay | Admitting: Cardiothoracic Surgery

## 2014-01-11 VITALS — BP 140/97 | HR 96 | Ht 70.0 in | Wt 153.0 lb

## 2014-01-11 DIAGNOSIS — R0789 Other chest pain: Secondary | ICD-10-CM

## 2014-01-11 DIAGNOSIS — R079 Chest pain, unspecified: Secondary | ICD-10-CM

## 2014-01-11 MED ORDER — IOHEXOL 300 MG/ML  SOLN
75.0000 mL | Freq: Once | INTRAMUSCULAR | Status: AC | PRN
  Administered 2014-01-11: 75 mL via INTRAVENOUS
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
## 2014-01-19 ENCOUNTER — Emergency Department (HOSPITAL_COMMUNITY)
Admission: EM | Admit: 2014-01-19 | Discharge: 2014-01-19 | Disposition: A | Discharge status: Home / Self Care | Payer: Self-pay | Attending: Emergency Medicine | Admitting: Emergency Medicine

## 2014-01-19 ENCOUNTER — Emergency Department (HOSPITAL_COMMUNITY): Payer: Self-pay

## 2014-01-19 ENCOUNTER — Encounter (HOSPITAL_COMMUNITY): Payer: Self-pay | Admitting: Emergency Medicine

## 2014-01-19 DIAGNOSIS — Z87891 Personal history of nicotine dependence: Secondary | ICD-10-CM | POA: Insufficient documentation

## 2014-01-19 DIAGNOSIS — Z87828 Personal history of other (healed) physical injury and trauma: Secondary | ICD-10-CM | POA: Insufficient documentation

## 2014-01-19 DIAGNOSIS — R079 Chest pain, unspecified: Secondary | ICD-10-CM | POA: Insufficient documentation

## 2014-01-19 DIAGNOSIS — I10 Essential (primary) hypertension: Secondary | ICD-10-CM | POA: Insufficient documentation

## 2014-01-19 DIAGNOSIS — Z7982 Long term (current) use of aspirin: Secondary | ICD-10-CM | POA: Insufficient documentation

## 2014-01-19 DIAGNOSIS — Z79899 Other long term (current) drug therapy: Secondary | ICD-10-CM | POA: Insufficient documentation

## 2014-01-19 LAB — CBC WITH DIFFERENTIAL/PLATELET
Basophils Absolute: 0 10*3/uL (ref 0.0–0.1)
Basophils Relative: 0 % (ref 0–1)
Eosinophils Absolute: 0.3 10*3/uL (ref 0.0–0.7)
Eosinophils Relative: 2 % (ref 0–5)
HCT: 36.1 % — ABNORMAL LOW (ref 39.0–52.0)
Hemoglobin: 12.7 g/dL — ABNORMAL LOW (ref 13.0–17.0)
Lymphocytes Relative: 21 % (ref 12–46)
Lymphs Abs: 2.9 10*3/uL (ref 0.7–4.0)
MCH: 30.8 pg (ref 26.0–34.0)
MCHC: 35.2 g/dL (ref 30.0–36.0)
MCV: 87.4 fL (ref 78.0–100.0)
Monocytes Absolute: 0.8 10*3/uL (ref 0.1–1.0)
Monocytes Relative: 6 % (ref 3–12)
Neutro Abs: 9.5 10*3/uL — ABNORMAL HIGH (ref 1.7–7.7)
Neutrophils Relative %: 71 % (ref 43–77)
Platelets: UNDETERMINED 10*3/uL (ref 150–400)
RBC: 4.13 MIL/uL — ABNORMAL LOW (ref 4.22–5.81)
RDW: 12.7 % (ref 11.5–15.5)
WBC: 13.5 10*3/uL — ABNORMAL HIGH (ref 4.0–10.5)

## 2014-01-19 LAB — I-STAT TROPONIN, ED: Troponin i, poc: 0 ng/mL (ref 0.00–0.08)

## 2014-01-19 LAB — BASIC METABOLIC PANEL
Anion gap: 11 (ref 5–15)
BUN: 17 mg/dL (ref 6–23)
CO2: 24 mEq/L (ref 19–32)
Calcium: 9.3 mg/dL (ref 8.4–10.5)
Chloride: 107 mEq/L (ref 96–112)
Creatinine, Ser: 0.78 mg/dL (ref 0.50–1.35)
GFR calc Af Amer: >90 mL/min (ref >90)
GFR calc non Af Amer: >90 mL/min (ref >90)
Glucose, Bld: 99 mg/dL (ref 70–99)
Potassium: 4 mEq/L (ref 3.7–5.3)
Sodium: 142 mEq/L (ref 137–147)

## 2014-01-19 MED ORDER — OXYCODONE-ACETAMINOPHEN 5-325 MG PO TABS
2.0000 | ORAL_TABLET | Freq: Once | ORAL | Status: AC
  Administered 2014-01-19: 2 via ORAL
  Filled 2014-01-19: qty 2

## 2014-01-19 MED ORDER — LISINOPRIL 20 MG PO TABS
20.0000 mg | ORAL_TABLET | Freq: Once | ORAL | Status: AC
  Administered 2014-01-19: 20 mg via ORAL
  Filled 2014-01-19: qty 1

## 2014-01-19 MED ORDER — METOPROLOL TARTRATE 25 MG PO TABS
25.0000 mg | ORAL_TABLET | Freq: Once | ORAL | Status: AC
  Administered 2014-01-19: 25 mg via ORAL
  Filled 2014-01-19: qty 1

## 2014-01-19 NOTE — Discharge Instructions (Signed)

## 2014-01-19 NOTE — ED Notes (Signed)
Dr. James at bedside  

## 2014-01-19 NOTE — ED Notes (Signed)
Pt returns radiology, placed back on cardiac monitoring. 

## 2014-01-19 NOTE — ED Notes (Signed)
The pt is c/o lt chesdt pain for 2-3 weeks.  He has been [facility]us times [facility] and [facility] and he has not been diagnosed with cardiac  mproblems
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
## 2014-02-15 ENCOUNTER — Ambulatory Visit: Payer: Self-pay | Admitting: Cardiothoracic Surgery

## 2014-02-22 ENCOUNTER — Ambulatory Visit: Payer: Self-pay | Admitting: Cardiothoracic Surgery

## 2014-03-01 ENCOUNTER — Encounter: Payer: Self-pay | Admitting: Cardiothoracic Surgery

## 2014-03-01 ENCOUNTER — Ambulatory Visit (INDEPENDENT_AMBULATORY_CARE_PROVIDER_SITE_OTHER): Payer: Self-pay | Admitting: Cardiothoracic Surgery

## 2014-03-01 VITALS — BP 140/92 | HR 80 | Resp 20 | Ht 70.0 in | Wt 151.0 lb

## 2014-03-01 DIAGNOSIS — R079 Chest pain, unspecified: Secondary | ICD-10-CM
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
## 2014-03-29 ENCOUNTER — Encounter: Payer: Self-pay | Admitting: Cardiovascular Disease

## 2014-03-29 ENCOUNTER — Ambulatory Visit (INDEPENDENT_AMBULATORY_CARE_PROVIDER_SITE_OTHER): Payer: Self-pay | Admitting: Cardiovascular Disease

## 2014-03-29 VITALS — BP 100/60 | HR 78 | Ht 70.0 in | Wt 165.0 lb

## 2014-03-29 DIAGNOSIS — R072 Precordial pain: Secondary | ICD-10-CM

## 2014-03-29 DIAGNOSIS — F1410 Cocaine abuse, uncomplicated: Secondary | ICD-10-CM

## 2014-03-29 DIAGNOSIS — R079 Chest pain, unspecified: Secondary | ICD-10-CM

## 2014-03-29 DIAGNOSIS — I10 Essential (primary) hypertension: Secondary | ICD-10-CM

## 2014-03-29 NOTE — Patient Instructions (Signed)
Your physician wants you to follow-up in:   YEAR WITH  DR NISHAN You will receive a reminder letter in the mail two months in advance. If you don't receive a letter, please call our office to schedule the follow-up appointment.  Your physician recommends that you continue on your current medications as directed. Please refer to the Current Medication list given to you today.   Your physician has requested that you have an exercise tolerance test. For further information please visit [email or web address]. Please also follow instruction sheet, as given.  

## 2014-03-29 NOTE — Assessment & Plan Note (Signed)
Possibly related to cocaine use.  Not related to bullit fargment  Normal ECG Not that impressed with coronary calcium on CT F/U ETT

## 2014-03-29 NOTE — Assessment & Plan Note (Signed)
Well controlled.  Continue current medications and low sodium Dash type diet.    

## 2014-03-29 NOTE — Assessment & Plan Note (Signed)
Counseled on drug abuse with relationship to HTN and risk of MI and arrhythmia

## 2014-04-24 ENCOUNTER — Telehealth: Payer: Self-pay | Admitting: *Deleted

## 2014-04-26 ENCOUNTER — Ambulatory Visit: Payer: Self-pay | Admitting: Cardiothoracic Surgery

## 2014-05-10 ENCOUNTER — Encounter: Payer: Self-pay | Admitting: Nurse Practitioner

## 2014-07-26 ENCOUNTER — Emergency Department (HOSPITAL_COMMUNITY)
Admission: EM | Admit: 2014-07-26 | Discharge: 2014-07-26 | Disposition: A | Discharge status: Home / Self Care | Payer: Self-pay | Attending: Emergency Medicine | Admitting: Emergency Medicine

## 2014-07-26 ENCOUNTER — Emergency Department (HOSPITAL_COMMUNITY): Payer: Self-pay

## 2014-07-26 ENCOUNTER — Encounter (HOSPITAL_COMMUNITY): Payer: Self-pay | Admitting: Emergency Medicine

## 2014-07-26 DIAGNOSIS — I10 Essential (primary) hypertension: Secondary | ICD-10-CM | POA: Insufficient documentation

## 2014-07-26 DIAGNOSIS — Z87891 Personal history of nicotine dependence: Secondary | ICD-10-CM | POA: Insufficient documentation

## 2014-07-26 DIAGNOSIS — H9201 Otalgia, right ear: Secondary | ICD-10-CM | POA: Insufficient documentation

## 2014-07-26 DIAGNOSIS — R519 Headache, unspecified: Secondary | ICD-10-CM

## 2014-07-26 DIAGNOSIS — R51 Headache: Secondary | ICD-10-CM | POA: Insufficient documentation

## 2014-07-26 DIAGNOSIS — Z79899 Other long term (current) drug therapy: Secondary | ICD-10-CM | POA: Insufficient documentation

## 2014-07-26 DIAGNOSIS — Z792 Long term (current) use of antibiotics: Secondary | ICD-10-CM | POA: Insufficient documentation

## 2014-07-26 DIAGNOSIS — Z87828 Personal history of other (healed) physical injury and trauma: Secondary | ICD-10-CM | POA: Insufficient documentation

## 2014-07-26 LAB — BASIC METABOLIC PANEL
Anion gap: 7 (ref 5–15)
BUN: 13 mg/dL (ref 6–23)
CO2: 26 mmol/L (ref 19–32)
Calcium: 9 mg/dL (ref 8.4–10.5)
Chloride: 105 mmol/L (ref 96–112)
Creatinine, Ser: 0.83 mg/dL (ref 0.50–1.35)
GFR calc Af Amer: >90 mL/min (ref >90)
GFR calc non Af Amer: >90 mL/min (ref >90)
Glucose, Bld: 101 mg/dL — ABNORMAL HIGH (ref 70–99)
Potassium: 3.7 mmol/L (ref 3.5–5.1)
Sodium: 138 mmol/L (ref 135–145)

## 2014-07-26 LAB — CBC WITH DIFFERENTIAL/PLATELET
Basophils Absolute: 0 10*3/uL (ref 0.0–0.1)
Basophils Relative: 0 % (ref 0–1)
Eosinophils Absolute: 0.1 10*3/uL (ref 0.0–0.7)
Eosinophils Relative: 1 % (ref 0–5)
HCT: 40.2 % (ref 39.0–52.0)
Hemoglobin: 13.9 g/dL (ref 13.0–17.0)
Lymphocytes Relative: 19 % (ref 12–46)
Lymphs Abs: 2.7 10*3/uL (ref 0.7–4.0)
MCH: 30.6 pg (ref 26.0–34.0)
MCHC: 34.6 g/dL (ref 30.0–36.0)
MCV: 88.5 fL (ref 78.0–100.0)
Monocytes Absolute: 1.3 10*3/uL — ABNORMAL HIGH (ref 0.1–1.0)
Monocytes Relative: 9 % (ref 3–12)
Neutro Abs: 10.1 10*3/uL — ABNORMAL HIGH (ref 1.7–7.7)
Neutrophils Relative %: 71 % (ref 43–77)
Platelets: UNDETERMINED 10*3/uL (ref 150–400)
RBC: 4.54 MIL/uL (ref 4.22–5.81)
RDW: 12.9 % (ref 11.5–15.5)
WBC: 14.2 10*3/uL — ABNORMAL HIGH (ref 4.0–10.5)

## 2014-07-26 MED ORDER — HYDROCODONE-ACETAMINOPHEN 5-325 MG PO TABS
1.0000 | ORAL_TABLET | Freq: Once | ORAL | Status: AC
  Administered 2014-07-26: 1 via ORAL
  Filled 2014-07-26: qty 1

## 2014-07-26 MED ORDER — MECLIZINE HCL 25 MG PO TABS: ORAL_TABLET | ORAL | Status: DC

## 2014-07-26 MED ORDER — TRAMADOL HCL 50 MG PO TABS: 50.0000 mg | ORAL_TABLET | Freq: Four times a day (QID) | ORAL | Status: DC | PRN

## 2014-07-26 MED ORDER — AMOXICILLIN 500 MG PO CAPS: 500.0000 mg | ORAL_CAPSULE | Freq: Three times a day (TID) | ORAL | Status: DC

## 2014-07-26 NOTE — Discharge Instructions (Signed)
Follow up with your md next week if not improving °
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
## 2015-03-17 ENCOUNTER — Other Ambulatory Visit: Payer: Self-pay | Admitting: Cardiothoracic Surgery

## 2015-04-19 ENCOUNTER — Encounter (HOSPITAL_COMMUNITY): Payer: Self-pay | Admitting: Neurology

## 2015-04-19 ENCOUNTER — Emergency Department (HOSPITAL_COMMUNITY)
Admission: EM | Admit: 2015-04-19 | Discharge: 2015-04-19 | Discharge status: Against Medical Advice | Payer: Self-pay | Attending: Emergency Medicine | Admitting: Emergency Medicine

## 2015-04-19 DIAGNOSIS — R1110 Vomiting, unspecified: Secondary | ICD-10-CM | POA: Insufficient documentation

## 2015-04-19 DIAGNOSIS — M549 Dorsalgia, unspecified: Secondary | ICD-10-CM | POA: Insufficient documentation

## 2015-04-19 DIAGNOSIS — R51 Headache: Secondary | ICD-10-CM | POA: Insufficient documentation

## 2015-04-19 DIAGNOSIS — I10 Essential (primary) hypertension: Secondary | ICD-10-CM | POA: Insufficient documentation

## 2015-04-19 LAB — COMPREHENSIVE METABOLIC PANEL
ALT: 31 U/L (ref 17–63)
AST: 28 U/L (ref 15–41)
Albumin: 3.9 g/dL (ref 3.5–5.0)
Alkaline Phosphatase: 73 U/L (ref 38–126)
Anion gap: 8 (ref 5–15)
BUN: 17 mg/dL (ref 6–20)
CO2: 23 mmol/L (ref 22–32)
Calcium: 9.1 mg/dL (ref 8.9–10.3)
Chloride: 110 mmol/L (ref 101–111)
Creatinine, Ser: 1.03 mg/dL (ref 0.61–1.24)
GFR calc Af Amer: >60 mL/min (ref >60)
GFR calc non Af Amer: >60 mL/min (ref >60)
Glucose, Bld: 86 mg/dL (ref 65–99)
Potassium: 4 mmol/L (ref 3.5–5.1)
Sodium: 141 mmol/L (ref 135–145)
Total Bilirubin: 0.6 mg/dL (ref 0.3–1.2)
Total Protein: 7.1 g/dL (ref 6.5–8.1)

## 2015-04-19 LAB — LIPASE, BLOOD: Lipase: 64 U/L — ABNORMAL HIGH (ref 11–51)

## 2015-04-19 LAB — CBC
HCT: 38.1 % — ABNORMAL LOW (ref 39.0–52.0)
Hemoglobin: 13 g/dL (ref 13.0–17.0)
MCH: 31 pg (ref 26.0–34.0)
MCHC: 34.1 g/dL (ref 30.0–36.0)
MCV: 90.9 fL (ref 78.0–100.0)
Platelets: DECREASED 10*3/uL (ref 150–400)
RBC: 4.19 MIL/uL — ABNORMAL LOW (ref 4.22–5.81)
RDW: 13.4 % (ref 11.5–15.5)
WBC: 9 10*3/uL (ref 4.0–10.5)

## 2015-04-19 NOTE — ED Notes (Signed)
Called for pt with no answer 

## 2015-04-19 NOTE — ED Notes (Signed)
Called pt no answer °

## 2015-04-19 NOTE — ED Notes (Signed)
Patient called for room 3x

## 2015-04-19 NOTE — ED Notes (Addendum)
Pt reports n/v for several day, with left middle back pain. Has h/a, thinks he has an ear infection in his left ear. Has noticed blood streaks in stool for past several days. Has been coughing up brown sputum. Has HTN and ran out of BP meds last week.

## 2015-04-26 ENCOUNTER — Encounter (HOSPITAL_COMMUNITY): Payer: Self-pay | Admitting: Emergency Medicine

## 2015-04-26 ENCOUNTER — Emergency Department (HOSPITAL_COMMUNITY): Payer: Self-pay

## 2015-04-26 ENCOUNTER — Emergency Department (HOSPITAL_COMMUNITY)
Admission: EM | Admit: 2015-04-26 | Discharge: 2015-04-26 | Disposition: A | Discharge status: Home / Self Care | Payer: Self-pay | Attending: Emergency Medicine | Admitting: Emergency Medicine

## 2015-04-26 DIAGNOSIS — I10 Essential (primary) hypertension: Secondary | ICD-10-CM | POA: Insufficient documentation

## 2015-04-26 DIAGNOSIS — Z79899 Other long term (current) drug therapy: Secondary | ICD-10-CM | POA: Insufficient documentation

## 2015-04-26 DIAGNOSIS — Z87828 Personal history of other (healed) physical injury and trauma: Secondary | ICD-10-CM | POA: Insufficient documentation

## 2015-04-26 DIAGNOSIS — Z791 Long term (current) use of non-steroidal anti-inflammatories (NSAID): Secondary | ICD-10-CM | POA: Insufficient documentation

## 2015-04-26 DIAGNOSIS — R079 Chest pain, unspecified: Secondary | ICD-10-CM | POA: Insufficient documentation

## 2015-04-26 DIAGNOSIS — Z7982 Long term (current) use of aspirin: Secondary | ICD-10-CM | POA: Insufficient documentation

## 2015-04-26 DIAGNOSIS — Z87891 Personal history of nicotine dependence: Secondary | ICD-10-CM | POA: Insufficient documentation

## 2015-04-26 DIAGNOSIS — M549 Dorsalgia, unspecified: Secondary | ICD-10-CM | POA: Insufficient documentation

## 2015-04-26 LAB — I-STAT TROPONIN, ED
Troponin i, poc: 0 ng/mL (ref 0.00–0.08)
Troponin i, poc: 0 ng/mL (ref 0.00–0.08)

## 2015-04-26 LAB — HEPATIC FUNCTION PANEL
ALT: 25 U/L (ref 17–63)
AST: 23 U/L (ref 15–41)
Albumin: 4.2 g/dL (ref 3.5–5.0)
Alkaline Phosphatase: 71 U/L (ref 38–126)
Bilirubin, Direct: 0.2 mg/dL (ref 0.1–0.5)
Indirect Bilirubin: 1.2 mg/dL — ABNORMAL HIGH (ref 0.3–0.9)
Total Bilirubin: 1.4 mg/dL — ABNORMAL HIGH (ref 0.3–1.2)
Total Protein: 7.6 g/dL (ref 6.5–8.1)

## 2015-04-26 LAB — BASIC METABOLIC PANEL
Anion gap: 8 (ref 5–15)
BUN: 15 mg/dL (ref 6–20)
CO2: 31 mmol/L (ref 22–32)
Calcium: 9.2 mg/dL (ref 8.9–10.3)
Chloride: 104 mmol/L (ref 101–111)
Creatinine, Ser: 0.94 mg/dL (ref 0.61–1.24)
GFR calc Af Amer: >60 mL/min (ref >60)
GFR calc non Af Amer: >60 mL/min (ref >60)
Glucose, Bld: 79 mg/dL (ref 65–99)
Potassium: 3.8 mmol/L (ref 3.5–5.1)
Sodium: 143 mmol/L (ref 135–145)

## 2015-04-26 LAB — CBC
HCT: 41.2 % (ref 39.0–52.0)
Hemoglobin: 14 g/dL (ref 13.0–17.0)
MCH: 31 pg (ref 26.0–34.0)
MCHC: 34 g/dL (ref 30.0–36.0)
MCV: 91.2 fL (ref 78.0–100.0)
Platelets: UNDETERMINED 10*3/uL (ref 150–400)
RBC: 4.52 MIL/uL (ref 4.22–5.81)
RDW: 13.3 % (ref 11.5–15.5)
WBC: 9.3 10*3/uL (ref 4.0–10.5)

## 2015-04-26 LAB — LIPASE, BLOOD: Lipase: 56 U/L — ABNORMAL HIGH (ref 11–51)

## 2015-04-26 MED ORDER — CLONIDINE HCL 0.1 MG PO TABS
0.1000 mg | ORAL_TABLET | Freq: Once | ORAL | Status: AC
  Administered 2015-04-26: 0.1 mg via ORAL
  Filled 2015-04-26: qty 1

## 2015-04-26 MED ORDER — LISINOPRIL-HYDROCHLOROTHIAZIDE 20-12.5 MG PO TABS: 1.0000 | ORAL_TABLET | Freq: Every day | ORAL | Status: DC

## 2015-04-26 MED ORDER — HYDROCHLOROTHIAZIDE 12.5 MG PO CAPS
12.5000 mg | ORAL_CAPSULE | Freq: Once | ORAL | Status: AC
  Administered 2015-04-26: 12.5 mg via ORAL
  Filled 2015-04-26: qty 1

## 2015-04-26 MED ORDER — ACETAMINOPHEN 325 MG PO TABS
650.0000 mg | ORAL_TABLET | Freq: Once | ORAL | Status: AC
  Administered 2015-04-26: 650 mg via ORAL
  Filled 2015-04-26: qty 2

## 2015-04-26 MED ORDER — METOPROLOL TARTRATE 25 MG PO TABS: 25.0000 mg | ORAL_TABLET | Freq: Two times a day (BID) | ORAL | Status: DC

## 2015-04-26 MED ORDER — FAMOTIDINE IN NACL 20-0.9 MG/50ML-% IV SOLN
20.0000 mg | Freq: Once | INTRAVENOUS | Status: AC
  Administered 2015-04-26: 20 mg via INTRAVENOUS
  Filled 2015-04-26: qty 50

## 2015-04-26 MED ORDER — LISINOPRIL 20 MG PO TABS
20.0000 mg | ORAL_TABLET | Freq: Once | ORAL | Status: AC
  Administered 2015-04-26: 20 mg via ORAL
  Filled 2015-04-26: qty 1

## 2015-04-26 MED ORDER — IOHEXOL 350 MG/ML SOLN
100.0000 mL | Freq: Once | INTRAVENOUS | Status: AC | PRN
Start: 2015-04-26 — End: 2015-04-26
  Administered 2015-04-26: 100 mL via INTRAVENOUS

## 2015-04-26 MED ORDER — DEXAMETHASONE SODIUM PHOSPHATE 10 MG/ML IJ SOLN
10.0000 mg | Freq: Once | INTRAMUSCULAR | Status: AC
  Administered 2015-04-26: 10 mg via INTRAVENOUS
  Filled 2015-04-26: qty 1

## 2015-04-26 MED ORDER — DIPHENHYDRAMINE HCL 50 MG/ML IJ SOLN
12.5000 mg | Freq: Once | INTRAMUSCULAR | Status: AC
  Administered 2015-04-26: 12.5 mg via INTRAVENOUS
  Filled 2015-04-26: qty 1

## 2015-04-26 MED ORDER — CIPROFLOXACIN HCL 500 MG PO TABS: 500.0000 mg | ORAL_TABLET | Freq: Two times a day (BID) | ORAL | Status: DC

## 2015-04-26 MED ORDER — PROCHLORPERAZINE EDISYLATE 5 MG/ML IJ SOLN: 10.0000 mg | Freq: Four times a day (QID) | INTRAMUSCULAR | Status: DC | PRN

## 2015-04-26 NOTE — Discharge Instructions (Signed)
You were seen today for your chest pain and headache.  Likely these are related to your elevated blood pressure.  Fill the prescriptions prescribed and take them as directed to help control your blood pressure.  Also take the antibiotic for the small area of possible infection in your lung.  Keep your follow up appointment with your primary care physician.  Hypertension Hypertension, commonly called high blood pressure, is when the force of blood pumping through your arteries is too strong. Your arteries are the blood vessels that carry blood from your heart throughout your body. A blood pressure reading consists of a higher number over a lower number, such as 110/72. The higher number (systolic) is the pressure inside your arteries when your heart pumps. The lower number (diastolic) is the pressure inside your arteries when your heart relaxes. Ideally you want your blood pressure below 120/80. Hypertension forces your heart to work harder to pump blood. Your arteries may become narrow or stiff. Having untreated or uncontrolled hypertension can cause heart attack, stroke, kidney disease, and other problems. RISK FACTORS Some risk factors for high blood pressure are controllable. Others are not.  Risk factors you cannot control include:   Race. You may be at higher risk if you are African American.  Age. Risk increases with age.  Gender. Men are at higher risk than women before age 59 years. After age 93, women are at higher risk than men. Risk factors you can control include:  Not getting enough exercise or physical activity.  Being overweight.  Getting too much fat, sugar, calories, or salt in your diet.  Drinking too much alcohol. SIGNS AND SYMPTOMS Hypertension does not usually cause signs or symptoms. Extremely high blood pressure (hypertensive crisis) may cause headache, anxiety, shortness of breath, and nosebleed. DIAGNOSIS To check if you have hypertension, your health care provider  will measure your blood pressure while you are seated, with your arm held at the level of your heart. It should be measured at least twice using the same arm. Certain conditions can cause a difference in blood pressure between your right and left arms. A blood pressure reading that is higher than normal on one occasion does not mean that you need treatment. If it is not clear whether you have high blood pressure, you may be asked to return on a different day to have your blood pressure checked again. Or, you may be asked to monitor your blood pressure at home for 1 or more weeks. TREATMENT Treating high blood pressure includes making lifestyle changes and possibly taking medicine. Living a healthy lifestyle can help lower high blood pressure. You may need to change some of your habits. Lifestyle changes may include:  Following the DASH diet. This diet is high in fruits, vegetables, and whole grains. It is low in salt, red meat, and added sugars.  Keep your sodium intake below 2,300 mg per day.  Getting at least 30-45 minutes of aerobic exercise at least 4 times per week.  Losing weight if necessary.  Not smoking.  Limiting alcoholic beverages.  Learning ways to reduce stress. Your health care provider may prescribe medicine if lifestyle changes are not enough to get your blood pressure under control, and if one of the following is true:  You are 32-39 years of age and your systolic blood pressure is above 140.  You are 58 years of age or older, and your systolic blood pressure is above 150.  Your diastolic blood pressure is above 90.  You have diabetes, and your systolic blood pressure is over 140 or your diastolic blood pressure is over 90.  You have kidney disease and your blood pressure is above 140/90.  You have heart disease and your blood pressure is above 140/90. Your personal target blood pressure may vary depending on your medical conditions, your age, and other factors. HOME  CARE INSTRUCTIONS  Have your blood pressure rechecked as directed by your health care provider.   Take medicines only as directed by your health care provider. Follow the directions carefully. Blood pressure medicines must be taken as prescribed. The medicine does not work as well when you skip doses. Skipping doses also puts you at risk for problems.  Do not smoke.   Monitor your blood pressure at home as directed by your health care provider. SEEK MEDICAL CARE IF:   You think you are having a reaction to medicines taken.  You have recurrent headaches or feel dizzy.  You have swelling in your ankles.  You have trouble with your vision. SEEK IMMEDIATE MEDICAL CARE IF:  You develop a severe headache or confusion.  You have unusual weakness, numbness, or feel faint.  You have severe chest or abdominal pain.  You vomit repeatedly.  You have trouble breathing. MAKE SURE YOU:   Understand these instructions.  Will watch your condition.  Will get help right away if you are not doing well or get worse.   This information is not intended to replace advice given to you by your health care provider. Make sure you discuss any questions you have with your health care provider.   Document Released: 03/31/2005 Document Revised: 08/15/2014 Document Reviewed: 01/21/2013 Elsevier Interactive Patient Education 2016 [employer] Inc[employer]  Nonspecific Chest Pain  Chest pain can be caused by many different conditions. There is always a chance that your pain could be related to something serious, such as a heart attack or a blood clot in your lungs. Chest pain can also be caused by conditions that are not life-threatening. If you have chest pain, it is very important to follow up with your health care provider. CAUSES  Chest pain can be caused by:  Heartburn.  Pneumonia or bronchitis.  Anxiety or stress.  Inflammation around your heart (pericarditis) or lung (pleuritis or pleurisy).  A  blood clot in your lung.  A collapsed lung (pneumothorax). It can develop suddenly on its own (spontaneous pneumothorax) or from trauma to the chest.  Shingles infection (varicella-zoster virus).  Heart attack.  Damage to the bones, muscles, and cartilage that make up your chest wall. This can include:  Bruised bones due to injury.  Strained muscles or cartilage due to frequent or repeated coughing or overwork.  Fracture to one or more ribs.  Sore cartilage due to inflammation (costochondritis). RISK FACTORS  Risk factors for chest pain may include:  Activities that increase your risk for trauma or injury to your chest.  Respiratory infections or conditions that cause frequent coughing.  Medical conditions or overeating that can cause heartburn.  Heart disease or family history of heart disease.  Conditions or health behaviors that increase your risk of developing a blood clot.  Having had chicken pox (varicella zoster). SIGNS AND SYMPTOMS Chest pain can feel like:  Burning or tingling on the surface of your chest or deep in your chest.  Crushing, pressure, aching, or squeezing pain.  Dull or sharp pain that is worse when you move, cough, or take a deep breath.  Pain that is  also felt in your back, neck, shoulder, or arm, or pain that spreads to any of these areas. Your chest pain may come and go, or it may stay constant. DIAGNOSIS Lab tests or other studies may be needed to find the cause of your pain. Your health care provider may have you take a test called an ambulatory ECG (electrocardiogram). An ECG records your heartbeat patterns at the time the test is performed. You may also have other tests, such as:  Transthoracic echocardiogram (TTE). During echocardiography, sound waves are used to create a picture of all of the heart structures and to look at how blood flows through your heart.  Transesophageal echocardiogram (TEE).This is a more advanced imaging test  that obtains images from inside your body. It allows your health care provider to see your heart in finer detail.  Cardiac monitoring. This allows your health care provider to monitor your heart rate and rhythm in real time.  Holter monitor. This is a portable device that records your heartbeat and can help to diagnose abnormal heartbeats. It allows your health care provider to track your heart activity for several days, if needed.  Stress tests. These can be done through exercise or by taking medicine that makes your heart beat more quickly.  Blood tests.  Imaging tests. TREATMENT  Your treatment depends on what is causing your chest pain. Treatment may include:  Medicines. These may include:  Acid blockers for heartburn.  Anti-inflammatory medicine.  Pain medicine for inflammatory conditions.  Antibiotic medicine, if an infection is present.  Medicines to dissolve blood clots.  Medicines to treat coronary artery disease.  Supportive care for conditions that do not require medicines. This may include:  Resting.  Applying heat or cold packs to injured areas.  Limiting activities until pain decreases. HOME CARE INSTRUCTIONS  If you were prescribed an antibiotic medicine, finish it all even if you start to feel better.  Avoid any activities that bring on chest pain.  Do not use any tobacco products, including cigarettes, chewing tobacco, or electronic cigarettes. If you need help quitting, ask your health care provider.  Do not drink alcohol.  Take medicines only as directed by your health care provider.  Keep all follow-up visits as directed by your health care provider. This is important. This includes any further testing if your chest pain does not go away.  If heartburn is the cause for your chest pain, you may be told to keep your head raised (elevated) while sleeping. This reduces the chance that acid will go from your stomach into your esophagus.  Make  lifestyle changes as directed by your health care provider. These may include:  Getting regular exercise. Ask your health care provider to suggest some activities that are safe for you.  Eating a heart-healthy diet. A registered dietitian can help you to learn healthy eating options.  Maintaining a healthy weight.  Managing diabetes, if necessary.  Reducing stress. SEEK MEDICAL CARE IF:  Your chest pain does not go away after treatment.  You have a rash with blisters on your chest.  You have a fever. SEEK IMMEDIATE MEDICAL CARE IF:   Your chest pain is worse.  You have an increasing cough, or you cough up blood.  You have severe abdominal pain.  You have severe weakness.  You faint.  You have chills.  You have sudden, unexplained chest discomfort.  You have sudden, unexplained discomfort in your arms, back, neck, or jaw.  You have shortness of breath  at any time.  You suddenly start to sweat, or your skin gets clammy.  You feel nauseous or you vomit.  You suddenly feel light-headed or dizzy.  Your heart begins to beat quickly, or it feels like it is skipping beats. These symptoms may represent a serious problem that is an emergency. Do not wait to see if the symptoms will go away. Get medical help right away. Call your local emergency services (911 in the [place] Do not drive yourself to the hospital.   This information is not intended to replace advice given to you by your health care provider. Make sure you discuss any questions you have with your health care provider.   Document Released: 01/08/2005 Document Revised: 04/21/2014 Document Reviewed: 11/04/2013 Elsevier Interactive Patient Education 2016 [employer].  Lower respiratory infection, Adult Pneumonia is an infection of the lungs. There are different types of pneumonia. One type can develop while a person is in a hospital. A different type, called community-acquired pneumonia, develops in people who are  not, or have not recently been, in the hospital or other health care facility.  CAUSES Pneumonia may be caused by bacteria, viruses, or funguses. Community-acquired pneumonia is often caused by Streptococcus pneumonia bacteria. These bacteria are often passed from one person to another by breathing in droplets from the cough or sneeze of an infected person. RISK FACTORS The condition is more likely to develop in:  People who havechronic diseases, such as chronic obstructive pulmonary disease (COPD), asthma, congestive heart failure, cystic fibrosis, diabetes, or kidney disease.  People who haveearly-stage or late-stage HIV.  People who havesickle cell disease.  People who havehad their spleen removed (splenectomy).  People who havepoor Administrator.  People who havemedical conditions that increase the risk of breathing in (aspirating) secretions their own mouth and nose.   People who havea weakened immune system (immunocompromised).  People who smoke.  People whotravel to areas where pneumonia-causing germs commonly exist.  People whoare around animal habitats or animals that have pneumonia-causing germs, including birds, bats, rabbits, cats, and farm animals. SYMPTOMS Symptoms of this condition include:  Adry cough.  A wet (productive) cough.  Fever.  Sweating.  Chest pain, especially when breathing deeply or coughing.  Rapid breathing or difficulty breathing.  Shortness of breath.  Shaking chills.  Fatigue.  Muscle aches. DIAGNOSIS Your health care provider will take a medical history and perform a physical exam. You may also have other tests, including:  Imaging studies of your chest, including X-rays.  Tests to check your blood oxygen level and other blood gases.  Other tests on blood, mucus (sputum), fluid around your lungs (pleural fluid), and urine. If your pneumonia is severe, other tests may be done to identify the specific cause of your  illness. TREATMENT The type of treatment that you receive depends on many factors, such as the cause of your pneumonia, the medicines you take, and other medical conditions that you have. For most adults, treatment and recovery from pneumonia may occur at home. In some cases, treatment must happen in a hospital. Treatment may include:  Antibiotic medicines, if the pneumonia was caused by bacteria.  Antiviral medicines, if the pneumonia was caused by a virus.  Medicines that are given by mouth or through an IV tube.  Oxygen.  Respiratory therapy. Although rare, treating severe pneumonia may include:  Mechanical ventilation. This is done if you are not breathing well on your own and you cannot maintain a safe blood oxygen level.  Thoracentesis.  This procedureremoves fluid around one lung or both lungs to help you breathe better. HOME CARE INSTRUCTIONS  Take over-the-counter and prescription medicines only as told by your health care provider.  Only takecough medicine if you are losing sleep. Understand that cough medicine can prevent your body's natural ability to remove mucus from your lungs.  If you were prescribed an antibiotic medicine, take it as told by your health care provider. Do not stop taking the antibiotic even if you start to feel better.  Sleep in a semi-upright position at night. Try sleeping in a reclining chair, or place a few pillows under your head.  Do not use tobacco products, including cigarettes, chewing tobacco, and e-cigarettes. If you need help quitting, ask your health care provider.  Drink enough water to keep your urine clear or pale yellow. This will help to thin out mucus secretions in your lungs. PREVENTION There are ways that you can decrease your risk of developing community-acquired pneumonia. Consider getting a pneumococcal vaccine if:  You are older than 51 years of age.  You are older than 51 years of age and are undergoing cancer treatment,  have chronic lung disease, or have other medical conditions that affect your immune system. Ask your health care provider if this applies to you. There are different types and schedules of pneumococcal vaccines. Ask your health care provider which vaccination option is best for you. You may also prevent community-acquired pneumonia if you take these actions:  Get an influenza vaccine every year. Ask your health care provider which type of influenza vaccine is best for you.  Go to the dentist on a regular basis.  Wash your hands often. Use hand sanitizer if soap and water are not available. SEEK MEDICAL CARE IF:  You have a fever.  You are losing sleep because you cannot control your cough with cough medicine. SEEK IMMEDIATE MEDICAL CARE IF:  You have worsening shortness of breath.  You have increased chest pain.  Your sickness becomes worse, especially if you are an older adult or have a weakened immune system.  You cough up blood.   This information is not intended to replace advice given to you by your health care provider. Make sure you discuss any questions you have with your health care provider.   Document Released: 03/31/2005 Document Revised: 12/20/2014 Document Reviewed: 07/26/2014 Elsevier Interactive Patient Education [employer]2016 Elsevier Inc.
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
## 2015-04-26 NOTE — ED Notes (Signed)
Pt c/o sharp left chest pain radiating to back onset 04-17-15, worsened today, pt states that today he felt like his "heart dropped." Not related to activity. Pain with inspiration.
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
## 2015-07-13 ENCOUNTER — Ambulatory Visit: Payer: Self-pay | Admitting: Cardiovascular Disease

## 2015-11-06 ENCOUNTER — Emergency Department (HOSPITAL_COMMUNITY)
Admission: EM | Admit: 2015-11-06 | Discharge: 2015-11-06 | Disposition: A | Discharge status: Home / Self Care | Payer: Self-pay | Attending: Emergency Medicine | Admitting: Emergency Medicine

## 2015-11-06 ENCOUNTER — Encounter (HOSPITAL_COMMUNITY): Payer: Self-pay | Admitting: Vascular Surgery

## 2015-11-06 DIAGNOSIS — H16131 Photokeratitis, right eye: Secondary | ICD-10-CM | POA: Insufficient documentation

## 2015-11-06 DIAGNOSIS — I10 Essential (primary) hypertension: Secondary | ICD-10-CM | POA: Insufficient documentation

## 2015-11-06 DIAGNOSIS — H16133 Photokeratitis, bilateral: Secondary | ICD-10-CM

## 2015-11-06 DIAGNOSIS — H16132 Photokeratitis, left eye: Secondary | ICD-10-CM | POA: Insufficient documentation

## 2015-11-06 DIAGNOSIS — Z87891 Personal history of nicotine dependence: Secondary | ICD-10-CM | POA: Insufficient documentation

## 2015-11-06 DIAGNOSIS — Z7982 Long term (current) use of aspirin: Secondary | ICD-10-CM | POA: Insufficient documentation

## 2015-11-06 MED ORDER — TETRACAINE HCL 0.5 % OP SOLN
1.0000 [drp] | Freq: Once | OPHTHALMIC | Status: AC
  Administered 2015-11-06: 1 [drp] via OPHTHALMIC
  Filled 2015-11-06: qty 2

## 2015-11-06 MED ORDER — POLYMYXIN B-TRIMETHOPRIM 10000-0.1 UNIT/ML-% OP SOLN: 1.0000 [drp] | OPHTHALMIC | 0 refills | Status: DC

## 2015-11-06 MED ORDER — FLUORESCEIN SODIUM 1 MG OP STRP
1.0000 | ORAL_STRIP | Freq: Once | OPHTHALMIC | Status: AC
  Administered 2015-11-06: 1 via OPHTHALMIC
  Filled 2015-11-06: qty 1

## 2015-11-06 MED ORDER — HYDROCODONE-ACETAMINOPHEN 5-325 MG PO TABS: 1.0000 | ORAL_TABLET | Freq: Four times a day (QID) | ORAL | 0 refills | Status: DC | PRN
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
## 2015-11-06 NOTE — Discharge Instructions (Signed)
You have been diagnosed with Welders' Flash, or photokeratitis.  Please use eyedrops and recommended.  Please follow-up with the eye doctor listed above in 2 days if not better.
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
## 2015-11-28 ENCOUNTER — Encounter (HOSPITAL_COMMUNITY): Payer: Self-pay | Admitting: *Deleted

## 2015-11-28 ENCOUNTER — Emergency Department (HOSPITAL_COMMUNITY)
Admission: EM | Admit: 2015-11-28 | Discharge: 2015-11-28 | Disposition: A | Discharge status: Home / Self Care | Payer: Self-pay | Attending: Emergency Medicine | Admitting: Emergency Medicine

## 2015-11-28 DIAGNOSIS — H16133 Photokeratitis, bilateral: Secondary | ICD-10-CM | POA: Insufficient documentation

## 2015-11-28 DIAGNOSIS — Y990 Civilian activity done for income or pay: Secondary | ICD-10-CM | POA: Insufficient documentation

## 2015-11-28 DIAGNOSIS — Z87891 Personal history of nicotine dependence: Secondary | ICD-10-CM | POA: Insufficient documentation

## 2015-11-28 DIAGNOSIS — W890XXA Exposure to welding light (arc), initial encounter: Secondary | ICD-10-CM | POA: Insufficient documentation

## 2015-11-28 DIAGNOSIS — Y939 Activity, unspecified: Secondary | ICD-10-CM | POA: Insufficient documentation

## 2015-11-28 DIAGNOSIS — Z79899 Other long term (current) drug therapy: Secondary | ICD-10-CM | POA: Insufficient documentation

## 2015-11-28 DIAGNOSIS — Y929 Unspecified place or not applicable: Secondary | ICD-10-CM | POA: Insufficient documentation

## 2015-11-28 DIAGNOSIS — I10 Essential (primary) hypertension: Secondary | ICD-10-CM | POA: Insufficient documentation

## 2015-11-28 DIAGNOSIS — Z7982 Long term (current) use of aspirin: Secondary | ICD-10-CM | POA: Insufficient documentation

## 2015-11-28 MED ORDER — TETRACAINE HCL 0.5 % OP SOLN
1.0000 [drp] | Freq: Once | OPHTHALMIC | Status: AC
Start: 2015-11-28 — End: 2015-11-28
  Administered 2015-11-28: 1 [drp] via OPHTHALMIC
  Filled 2015-11-28: qty 2

## 2015-11-28 MED ORDER — FLUORESCEIN SODIUM 1 MG OP STRP
1.0000 | ORAL_STRIP | Freq: Once | OPHTHALMIC | Status: AC
  Administered 2015-11-28: 1 via OPHTHALMIC
  Filled 2015-11-28: qty 1
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
## 2015-11-28 NOTE — ED Notes (Signed)
Checked patient eyes patient was 20/50 both eyes and 20/50 right and left eye he was 20/70 patient stated that he does not wear glasses

## 2015-11-28 NOTE — ED Notes (Signed)
Declined W/C at D/C and was escorted to lobby by RN. 
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
## 2016-07-10 ENCOUNTER — Ambulatory Visit (INDEPENDENT_AMBULATORY_CARE_PROVIDER_SITE_OTHER): Payer: Self-pay | Admitting: Otolaryngology

## 2016-09-26 ENCOUNTER — Emergency Department (HOSPITAL_COMMUNITY)
Admission: EM | Admit: 2016-09-26 | Discharge: 2016-09-26 | Disposition: A | Discharge status: Home / Self Care | Payer: Self-pay | Attending: Emergency Medicine | Admitting: Emergency Medicine

## 2016-09-26 ENCOUNTER — Encounter (HOSPITAL_COMMUNITY): Payer: Self-pay | Admitting: Emergency Medicine

## 2016-09-26 DIAGNOSIS — H16131 Photokeratitis, right eye: Secondary | ICD-10-CM | POA: Insufficient documentation

## 2016-09-26 DIAGNOSIS — Z79899 Other long term (current) drug therapy: Secondary | ICD-10-CM | POA: Insufficient documentation

## 2016-09-26 DIAGNOSIS — Z7982 Long term (current) use of aspirin: Secondary | ICD-10-CM | POA: Insufficient documentation

## 2016-09-26 DIAGNOSIS — Y929 Unspecified place or not applicable: Secondary | ICD-10-CM | POA: Insufficient documentation

## 2016-09-26 DIAGNOSIS — Y990 Civilian activity done for income or pay: Secondary | ICD-10-CM | POA: Insufficient documentation

## 2016-09-26 DIAGNOSIS — I10 Essential (primary) hypertension: Secondary | ICD-10-CM | POA: Insufficient documentation

## 2016-09-26 DIAGNOSIS — Z7902 Long term (current) use of antithrombotics/antiplatelets: Secondary | ICD-10-CM | POA: Insufficient documentation

## 2016-09-26 DIAGNOSIS — Y9389 Activity, other specified: Secondary | ICD-10-CM | POA: Insufficient documentation

## 2016-09-26 DIAGNOSIS — W311XXA Contact with metalworking machines, initial encounter: Secondary | ICD-10-CM | POA: Insufficient documentation

## 2016-09-26 DIAGNOSIS — Z87891 Personal history of nicotine dependence: Secondary | ICD-10-CM | POA: Insufficient documentation

## 2016-09-26 MED ORDER — HYDROCODONE-ACETAMINOPHEN 5-325 MG PO TABS: 1.0000 | ORAL_TABLET | Freq: Four times a day (QID) | ORAL | 0 refills | Status: DC | PRN

## 2016-09-26 MED ORDER — TETRACAINE HCL 0.5 % OP SOLN
2.0000 [drp] | Freq: Once | OPHTHALMIC | Status: AC
  Administered 2016-09-26: 2 [drp] via OPHTHALMIC
  Filled 2016-09-26: qty 4

## 2016-09-26 MED ORDER — POLYMYXIN B-TRIMETHOPRIM 10000-0.1 UNIT/ML-% OP SOLN: 2.0000 [drp] | OPHTHALMIC | 0 refills | Status: DC

## 2016-09-26 MED ORDER — FLUORESCEIN SODIUM 0.6 MG OP STRP
1.0000 | ORAL_STRIP | Freq: Once | OPHTHALMIC | Status: AC
  Administered 2016-09-26: 1 via OPHTHALMIC
  Filled 2016-09-26: qty 1
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
## 2016-09-26 NOTE — Discharge Instructions (Signed)
Medications: Polytrim, Norco  Treatment: Apply Polytrim to both eyes every 4 hours until you are seen by Dr. Charlotte SanesMcCuen. Take 1-2 Norco every 4-6 hours as needed for severe pain. Do not drive or operate machinery when taking this medication. You can take ibuprofen as prescribed over-the-counter for mild to moderate pain or if you have to drive.  Follow-up: Please follow-up with Dr. Marnee SpringMcCuen's office at 8:30 AM on Monday morning. Please return to emergency department if you develop any new or worsening symptoms.
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
## 2016-10-22 ENCOUNTER — Encounter (HOSPITAL_COMMUNITY): Payer: Self-pay | Admitting: Emergency Medicine

## 2016-10-22 ENCOUNTER — Emergency Department (HOSPITAL_COMMUNITY)
Admission: EM | Admit: 2016-10-22 | Discharge: 2016-10-23 | Disposition: A | Discharge status: Home / Self Care | Payer: Self-pay | Attending: Emergency Medicine | Admitting: Emergency Medicine

## 2016-10-22 ENCOUNTER — Emergency Department (HOSPITAL_COMMUNITY): Payer: Self-pay

## 2016-10-22 DIAGNOSIS — R42 Dizziness and giddiness: Secondary | ICD-10-CM | POA: Insufficient documentation

## 2016-10-22 DIAGNOSIS — R05 Cough: Secondary | ICD-10-CM | POA: Insufficient documentation

## 2016-10-22 DIAGNOSIS — R51 Headache: Secondary | ICD-10-CM | POA: Insufficient documentation

## 2016-10-22 DIAGNOSIS — R55 Syncope and collapse: Secondary | ICD-10-CM | POA: Insufficient documentation

## 2016-10-22 DIAGNOSIS — R059 Cough, unspecified: Secondary | ICD-10-CM

## 2016-10-22 DIAGNOSIS — R519 Headache, unspecified: Secondary | ICD-10-CM

## 2016-10-22 DIAGNOSIS — Z7982 Long term (current) use of aspirin: Secondary | ICD-10-CM | POA: Insufficient documentation

## 2016-10-22 DIAGNOSIS — F17210 Nicotine dependence, cigarettes, uncomplicated: Secondary | ICD-10-CM | POA: Insufficient documentation

## 2016-10-22 DIAGNOSIS — I10 Essential (primary) hypertension: Secondary | ICD-10-CM | POA: Insufficient documentation

## 2016-10-22 DIAGNOSIS — R110 Nausea: Secondary | ICD-10-CM | POA: Insufficient documentation

## 2016-10-22 DIAGNOSIS — Z79899 Other long term (current) drug therapy: Secondary | ICD-10-CM | POA: Insufficient documentation

## 2016-10-22 LAB — URINALYSIS, ROUTINE W REFLEX MICROSCOPIC
Bacteria, UA: NONE SEEN
Bilirubin Urine: NEGATIVE
Glucose, UA: NEGATIVE mg/dL
Ketones, ur: NEGATIVE mg/dL
Nitrite: NEGATIVE
Protein, ur: NEGATIVE mg/dL
Specific Gravity, Urine: 1.021 (ref 1.005–1.030)
pH: 5 (ref 5.0–8.0)

## 2016-10-22 LAB — BASIC METABOLIC PANEL
Anion gap: 9 (ref 5–15)
BUN: 10 mg/dL (ref 6–20)
CO2: 23 mmol/L (ref 22–32)
Calcium: 9.2 mg/dL (ref 8.9–10.3)
Chloride: 104 mmol/L (ref 101–111)
Creatinine, Ser: 0.8 mg/dL (ref 0.61–1.24)
GFR calc Af Amer: >60 mL/min (ref >60)
GFR calc non Af Amer: >60 mL/min (ref >60)
Glucose, Bld: 89 mg/dL (ref 65–99)
Potassium: 3.6 mmol/L (ref 3.5–5.1)
Sodium: 136 mmol/L (ref 135–145)

## 2016-10-22 LAB — CBC
HCT: 36.5 % — ABNORMAL LOW (ref 39.0–52.0)
Hemoglobin: 13 g/dL (ref 13.0–17.0)
MCH: 31.3 pg (ref 26.0–34.0)
MCHC: 35.6 g/dL (ref 30.0–36.0)
MCV: 88 fL (ref 78.0–100.0)
Platelets: 117 10*3/uL — ABNORMAL LOW (ref 150–400)
RBC: 4.15 MIL/uL — ABNORMAL LOW (ref 4.22–5.81)
RDW: 13.3 % (ref 11.5–15.5)
WBC: 9.8 10*3/uL (ref 4.0–10.5)

## 2016-10-22 LAB — POC OCCULT BLOOD, ED: Fecal Occult Bld: NEGATIVE

## 2016-10-22 MED ORDER — SODIUM CHLORIDE 0.9 % IV BOLUS (SEPSIS)
1000.0000 mL | Freq: Once | INTRAVENOUS | Status: AC
  Administered 2016-10-22: 1000 mL via INTRAVENOUS

## 2016-10-22 MED ORDER — METOCLOPRAMIDE HCL 5 MG/ML IJ SOLN
10.0000 mg | INTRAMUSCULAR | Status: AC
  Administered 2016-10-22: 10 mg via INTRAVENOUS
  Filled 2016-10-22: qty 2

## 2016-10-22 MED ORDER — KETOROLAC TROMETHAMINE 30 MG/ML IJ SOLN
30.0000 mg | Freq: Once | INTRAMUSCULAR | Status: AC
  Administered 2016-10-22: 30 mg via INTRAVENOUS
  Filled 2016-10-22: qty 1
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
## 2016-10-22 NOTE — ED Notes (Signed)
ED Provider at bedside assisted with rectal exam.
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
## 2016-10-23 ENCOUNTER — Emergency Department (HOSPITAL_COMMUNITY): Payer: Self-pay

## 2016-10-23 MED ORDER — BUTALBITAL-APAP-CAFFEINE 50-325-40 MG PO TABS: 1.0000 | ORAL_TABLET | Freq: Three times a day (TID) | ORAL | 0 refills | Status: AC | PRN

## 2016-10-23 NOTE — Discharge Instructions (Signed)
Your head CT was reassuring and your chest x-ray showed findings suggestive of early COPD. This is suspected to be due to your long-term smoking history. We advise that you continue with smoking cessation. For headaches, we recommend the use of Fioricet. If your headaches persist, you may benefit from follow-up with a neurologist. You may be referred to a neurologist by your primary care doctor. Also follow-up with your primary care doctor for recheck given your visit to the emergency department today. Continue your daily medications. You may return for new or concerning symptoms.

## 2016-10-23 NOTE — ED Notes (Signed)
Patient transported to CT 

## 2017-12-16 ENCOUNTER — Emergency Department (HOSPITAL_COMMUNITY): Payer: Self-pay

## 2017-12-16 ENCOUNTER — Encounter (HOSPITAL_COMMUNITY): Payer: Self-pay | Admitting: Emergency Medicine

## 2017-12-16 ENCOUNTER — Emergency Department (HOSPITAL_COMMUNITY)
Admission: EM | Admit: 2017-12-16 | Discharge: 2017-12-16 | Disposition: A | Discharge status: Home / Self Care | Payer: Self-pay | Attending: Emergency Medicine | Admitting: Emergency Medicine

## 2017-12-16 ENCOUNTER — Other Ambulatory Visit: Payer: Self-pay

## 2017-12-16 DIAGNOSIS — Z79899 Other long term (current) drug therapy: Secondary | ICD-10-CM | POA: Insufficient documentation

## 2017-12-16 DIAGNOSIS — R05 Cough: Secondary | ICD-10-CM | POA: Insufficient documentation

## 2017-12-16 DIAGNOSIS — Z87891 Personal history of nicotine dependence: Secondary | ICD-10-CM | POA: Insufficient documentation

## 2017-12-16 DIAGNOSIS — R059 Cough, unspecified: Secondary | ICD-10-CM

## 2017-12-16 DIAGNOSIS — H16133 Photokeratitis, bilateral: Secondary | ICD-10-CM | POA: Insufficient documentation

## 2017-12-16 DIAGNOSIS — I10 Essential (primary) hypertension: Secondary | ICD-10-CM | POA: Insufficient documentation

## 2017-12-16 DIAGNOSIS — Z7982 Long term (current) use of aspirin: Secondary | ICD-10-CM | POA: Insufficient documentation

## 2017-12-16 LAB — CBC
HCT: 40.6 % (ref 39.0–52.0)
Hemoglobin: 14.1 g/dL (ref 13.0–17.0)
MCH: 32 pg (ref 26.0–34.0)
MCHC: 34.7 g/dL (ref 30.0–36.0)
MCV: 92.1 fL (ref 78.0–100.0)
Platelets: UNDETERMINED 10*3/uL (ref 150–400)
RBC: 4.41 MIL/uL (ref 4.22–5.81)
RDW: 12.5 % (ref 11.5–15.5)
WBC: 10.9 10*3/uL — ABNORMAL HIGH (ref 4.0–10.5)

## 2017-12-16 LAB — I-STAT TROPONIN, ED: Troponin i, poc: 0 ng/mL (ref 0.00–0.08)

## 2017-12-16 LAB — BASIC METABOLIC PANEL
Anion gap: 10 (ref 5–15)
BUN: 13 mg/dL (ref 6–20)
CO2: 24 mmol/L (ref 22–32)
Calcium: 9.4 mg/dL (ref 8.9–10.3)
Chloride: 106 mmol/L (ref 98–111)
Creatinine, Ser: 0.93 mg/dL (ref 0.61–1.24)
GFR calc Af Amer: >60 mL/min (ref >60)
GFR calc non Af Amer: >60 mL/min (ref >60)
Glucose, Bld: 113 mg/dL — ABNORMAL HIGH (ref 70–99)
Potassium: 4 mmol/L (ref 3.5–5.1)
Sodium: 140 mmol/L (ref 135–145)

## 2017-12-16 MED ORDER — ALBUTEROL SULFATE (2.5 MG/3ML) 0.083% IN NEBU
5.0000 mg | INHALATION_SOLUTION | Freq: Once | RESPIRATORY_TRACT | Status: AC
  Administered 2017-12-16: 5 mg via RESPIRATORY_TRACT
  Filled 2017-12-16: qty 6

## 2017-12-16 MED ORDER — KETOROLAC TROMETHAMINE 30 MG/ML IJ SOLN
30.0000 mg | Freq: Once | INTRAMUSCULAR | Status: AC
  Administered 2017-12-16: 30 mg via INTRAVENOUS
  Filled 2017-12-16: qty 1

## 2017-12-16 MED ORDER — DOXYCYCLINE HYCLATE 100 MG PO CAPS: 100.0000 mg | ORAL_CAPSULE | Freq: Two times a day (BID) | ORAL | 0 refills | Status: DC

## 2017-12-16 MED ORDER — POLYMYXIN B-TRIMETHOPRIM 10000-0.1 UNIT/ML-% OP SOLN: 2.0000 [drp] | OPHTHALMIC | 0 refills | Status: DC

## 2017-12-16 MED ORDER — BENZONATATE 100 MG PO CAPS: 100.0000 mg | ORAL_CAPSULE | Freq: Three times a day (TID) | ORAL | 0 refills | Status: DC

## 2017-12-16 MED ORDER — FLUORESCEIN SODIUM 1 MG OP STRP
1.0000 | ORAL_STRIP | Freq: Once | OPHTHALMIC | Status: AC
  Administered 2017-12-16: 1 via OPHTHALMIC
  Filled 2017-12-16: qty 1

## 2017-12-16 MED ORDER — TETRACAINE HCL 0.5 % OP SOLN
2.0000 [drp] | Freq: Once | OPHTHALMIC | Status: AC
  Administered 2017-12-16: 2 [drp] via OPHTHALMIC
  Filled 2017-12-16: qty 4

## 2017-12-16 MED ORDER — ALBUTEROL SULFATE HFA 108 (90 BASE) MCG/ACT IN AERS
2.0000 | INHALATION_SPRAY | Freq: Once | RESPIRATORY_TRACT | Status: AC
  Administered 2017-12-16: 2 via RESPIRATORY_TRACT
  Filled 2017-12-16: qty 6.7

## 2017-12-16 NOTE — Discharge Instructions (Signed)
Use Polytrim drops every 4 hours.  Make sure to always wear your shield at work.  Please follow-up with the ophthalmologist, Dr. Sherrine Maples, today or tomorrow for further evaluation and treatment.  Please also follow-up with your regular doctor if your cough is not improving over the next week.  Please return the emergency department if you develop any new or worsening symptoms.
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
## 2017-12-16 NOTE — ED Triage Notes (Signed)
Pt came in for "coughing up phelm" however stated it "hurts my chest to take a deep breath." X2 weeks.  Pt states he has had a lung infections before and has been wheezing.

## 2017-12-16 NOTE — ED Notes (Addendum)
Pt c/o right eye burning from welding-- thinks there is something in it-- hx of same.  Pt smells of weed.

## 2017-12-16 NOTE — ED Notes (Signed)
Visual acuity test: Both eyes: 20/80 Right eye: 20/80 Left eye: 20/80

## 2018-04-14 HISTORY — PX: HYDROCELE EXCISION: SHX482

## 2018-08-13 DIAGNOSIS — K219 Gastro-esophageal reflux disease without esophagitis: Secondary | ICD-10-CM | POA: Diagnosis not present

## 2018-08-13 DIAGNOSIS — I10 Essential (primary) hypertension: Secondary | ICD-10-CM | POA: Diagnosis not present

## 2018-08-13 DIAGNOSIS — J449 Chronic obstructive pulmonary disease, unspecified: Secondary | ICD-10-CM | POA: Diagnosis not present

## 2018-09-02 DIAGNOSIS — N400 Enlarged prostate without lower urinary tract symptoms: Secondary | ICD-10-CM | POA: Diagnosis not present

## 2018-09-02 DIAGNOSIS — Z125 Encounter for screening for malignant neoplasm of prostate: Secondary | ICD-10-CM | POA: Diagnosis not present

## 2018-09-02 DIAGNOSIS — N430 Encysted hydrocele: Secondary | ICD-10-CM | POA: Diagnosis not present

## 2018-09-07 DIAGNOSIS — N430 Encysted hydrocele: Secondary | ICD-10-CM | POA: Diagnosis not present

## 2018-10-14 ENCOUNTER — Ambulatory Visit (INDEPENDENT_AMBULATORY_CARE_PROVIDER_SITE_OTHER): Payer: Self-pay | Admitting: Otolaryngology

## 2018-10-20 ENCOUNTER — Emergency Department (HOSPITAL_COMMUNITY): Payer: Medicaid Other

## 2018-10-20 ENCOUNTER — Other Ambulatory Visit: Payer: Self-pay

## 2018-10-20 ENCOUNTER — Encounter (HOSPITAL_COMMUNITY): Payer: Self-pay

## 2018-10-20 DIAGNOSIS — Z79899 Other long term (current) drug therapy: Secondary | ICD-10-CM | POA: Insufficient documentation

## 2018-10-20 DIAGNOSIS — R0789 Other chest pain: Secondary | ICD-10-CM | POA: Insufficient documentation

## 2018-10-20 DIAGNOSIS — M546 Pain in thoracic spine: Secondary | ICD-10-CM | POA: Insufficient documentation

## 2018-10-20 DIAGNOSIS — I10 Essential (primary) hypertension: Secondary | ICD-10-CM | POA: Insufficient documentation

## 2018-10-20 DIAGNOSIS — M62838 Other muscle spasm: Secondary | ICD-10-CM | POA: Insufficient documentation

## 2018-10-20 MED ORDER — SODIUM CHLORIDE 0.9% FLUSH: 3.0000 mL | Freq: Once | INTRAVENOUS | Status: DC
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
## 2018-10-21 ENCOUNTER — Emergency Department (HOSPITAL_COMMUNITY)
Admission: EM | Admit: 2018-10-21 | Discharge: 2018-10-21 | Disposition: A | Discharge status: Home / Self Care | Payer: Medicaid Other | Attending: Emergency Medicine | Admitting: Emergency Medicine

## 2018-10-21 DIAGNOSIS — R0789 Other chest pain: Secondary | ICD-10-CM

## 2018-10-21 DIAGNOSIS — T148XXA Other injury of unspecified body region, initial encounter: Secondary | ICD-10-CM

## 2018-10-21 LAB — CBC
HCT: 37.6 % — ABNORMAL LOW (ref 39.0–52.0)
Hemoglobin: 12.4 g/dL — ABNORMAL LOW (ref 13.0–17.0)
MCH: 30.8 pg (ref 26.0–34.0)
MCHC: 33 g/dL (ref 30.0–36.0)
MCV: 93.5 fL (ref 80.0–100.0)
Platelets: UNDETERMINED 10*3/uL (ref 150–400)
RBC: 4.02 MIL/uL — ABNORMAL LOW (ref 4.22–5.81)
RDW: 14.7 % (ref 11.5–15.5)
WBC: 9.6 10*3/uL (ref 4.0–10.5)
nRBC: 0.2 % (ref 0.0–0.2)

## 2018-10-21 LAB — BASIC METABOLIC PANEL
Anion gap: 9 (ref 5–15)
BUN: 20 mg/dL (ref 6–20)
CO2: 23 mmol/L (ref 22–32)
Calcium: 8.8 mg/dL — ABNORMAL LOW (ref 8.9–10.3)
Chloride: 104 mmol/L (ref 98–111)
Creatinine, Ser: 0.77 mg/dL (ref 0.61–1.24)
GFR calc Af Amer: >60 mL/min (ref >60)
GFR calc non Af Amer: >60 mL/min (ref >60)
Glucose, Bld: 102 mg/dL — ABNORMAL HIGH (ref 70–99)
Potassium: 4 mmol/L (ref 3.5–5.1)
Sodium: 136 mmol/L (ref 135–145)

## 2018-10-21 LAB — TROPONIN I (HIGH SENSITIVITY): Troponin I (High Sensitivity): 3 ng/L (ref <18)

## 2018-10-21 MED ORDER — KETOROLAC TROMETHAMINE 15 MG/ML IJ SOLN
15.0000 mg | Freq: Once | INTRAMUSCULAR | Status: AC
  Administered 2018-10-21: 15 mg via INTRAVENOUS
  Filled 2018-10-21: qty 1

## 2018-10-21 MED ORDER — KETOROLAC TROMETHAMINE 15 MG/ML IJ SOLN
INTRAMUSCULAR | Status: AC
  Filled 2018-10-21: qty 1

## 2018-10-21 NOTE — Discharge Instructions (Signed)
You may use over-the-counter Motrin (Ibuprofen), Acetaminophen (Tylenol), topical muscle creams such as SalonPas, Icy Hot, Bengay, etc. Please stretch, apply heat, and have massage therapy for additional assistance. ° °
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
## 2019-08-03 ENCOUNTER — Encounter (HOSPITAL_COMMUNITY): Payer: Self-pay | Admitting: Emergency Medicine

## 2019-08-03 ENCOUNTER — Other Ambulatory Visit: Payer: Self-pay

## 2019-08-03 ENCOUNTER — Emergency Department (HOSPITAL_COMMUNITY)
Admission: EM | Admit: 2019-08-03 | Discharge: 2019-08-03 | Disposition: A | Discharge status: Home / Self Care | Payer: 59 | Attending: Emergency Medicine | Admitting: Emergency Medicine

## 2019-08-03 DIAGNOSIS — Y939 Activity, unspecified: Secondary | ICD-10-CM | POA: Insufficient documentation

## 2019-08-03 DIAGNOSIS — X19XXXA Contact with other heat and hot substances, initial encounter: Secondary | ICD-10-CM | POA: Diagnosis not present

## 2019-08-03 DIAGNOSIS — Y929 Unspecified place or not applicable: Secondary | ICD-10-CM | POA: Diagnosis not present

## 2019-08-03 DIAGNOSIS — H16131 Photokeratitis, right eye: Secondary | ICD-10-CM

## 2019-08-03 DIAGNOSIS — S0591XA Unspecified injury of right eye and orbit, initial encounter: Secondary | ICD-10-CM | POA: Diagnosis present

## 2019-08-03 DIAGNOSIS — I10 Essential (primary) hypertension: Secondary | ICD-10-CM | POA: Diagnosis not present

## 2019-08-03 DIAGNOSIS — T2611XA Burn of cornea and conjunctival sac, right eye, initial encounter: Secondary | ICD-10-CM | POA: Insufficient documentation

## 2019-08-03 DIAGNOSIS — Y990 Civilian activity done for income or pay: Secondary | ICD-10-CM | POA: Insufficient documentation

## 2019-08-03 DIAGNOSIS — Z79899 Other long term (current) drug therapy: Secondary | ICD-10-CM | POA: Insufficient documentation

## 2019-08-03 DIAGNOSIS — Z87891 Personal history of nicotine dependence: Secondary | ICD-10-CM | POA: Insufficient documentation

## 2019-08-03 MED ORDER — FLUORESCEIN SODIUM 1 MG OP STRP
1.0000 | ORAL_STRIP | Freq: Once | OPHTHALMIC | Status: AC
  Administered 2019-08-03: 1 via OPHTHALMIC
  Filled 2019-08-03: qty 1

## 2019-08-03 MED ORDER — TETRACAINE HCL 0.5 % OP SOLN
2.0000 [drp] | Freq: Once | OPHTHALMIC | Status: AC
  Administered 2019-08-03: 2 [drp] via OPHTHALMIC
  Filled 2019-08-03: qty 4

## 2019-08-03 MED ORDER — ERYTHROMYCIN 5 MG/GM OP OINT: TOPICAL_OINTMENT | OPHTHALMIC | 0 refills | Status: DC
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
## 2019-08-18 DIAGNOSIS — M26609 Unspecified temporomandibular joint disorder, unspecified side: Secondary | ICD-10-CM | POA: Insufficient documentation

## 2019-08-18 DIAGNOSIS — R519 Headache, unspecified: Secondary | ICD-10-CM | POA: Insufficient documentation

## 2019-08-25 ENCOUNTER — Other Ambulatory Visit: Payer: Self-pay | Admitting: Orthopedic Surgery

## 2019-08-25 DIAGNOSIS — M542 Cervicalgia: Secondary | ICD-10-CM

## 2019-09-09 ENCOUNTER — Ambulatory Visit
Admission: RE | Admit: 2019-09-09 | Discharge: 2019-09-09 | Disposition: A | Discharge status: Home / Self Care | Payer: 59 | Source: Ambulatory Visit | Attending: Orthopedic Surgery | Admitting: Orthopedic Surgery

## 2019-09-09 ENCOUNTER — Other Ambulatory Visit: Payer: Self-pay

## 2019-09-09 DIAGNOSIS — M542 Cervicalgia: Secondary | ICD-10-CM

## 2019-11-02 ENCOUNTER — Emergency Department (HOSPITAL_COMMUNITY)
Admission: EM | Admit: 2019-11-02 | Discharge: 2019-11-02 | Disposition: A | Discharge status: Home / Self Care | Payer: 59 | Attending: Emergency Medicine | Admitting: Emergency Medicine

## 2019-11-02 ENCOUNTER — Encounter (HOSPITAL_COMMUNITY): Payer: Self-pay

## 2019-11-02 ENCOUNTER — Other Ambulatory Visit: Payer: Self-pay

## 2019-11-02 DIAGNOSIS — Z5321 Procedure and treatment not carried out due to patient leaving prior to being seen by health care provider: Secondary | ICD-10-CM | POA: Diagnosis not present

## 2019-11-02 DIAGNOSIS — H5711 Ocular pain, right eye: Secondary | ICD-10-CM | POA: Diagnosis present
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
## 2019-11-25 DIAGNOSIS — M47812 Spondylosis without myelopathy or radiculopathy, cervical region: Secondary | ICD-10-CM | POA: Insufficient documentation

## 2019-12-01 ENCOUNTER — Ambulatory Visit: Payer: 59 | Admitting: Neurology

## 2019-12-13 LAB — HM COLONOSCOPY

## 2020-05-30 ENCOUNTER — Ambulatory Visit (HOSPITAL_COMMUNITY)
Admission: EM | Admit: 2020-05-30 | Discharge: 2020-05-30 | Disposition: A | Discharge status: Home / Self Care | Payer: 59 | Attending: Family Medicine | Admitting: Family Medicine

## 2020-05-30 ENCOUNTER — Other Ambulatory Visit: Payer: Self-pay

## 2020-05-30 ENCOUNTER — Ambulatory Visit (INDEPENDENT_AMBULATORY_CARE_PROVIDER_SITE_OTHER): Payer: 59

## 2020-05-30 ENCOUNTER — Encounter (HOSPITAL_COMMUNITY): Payer: Self-pay | Admitting: Emergency Medicine

## 2020-05-30 DIAGNOSIS — R062 Wheezing: Secondary | ICD-10-CM

## 2020-05-30 DIAGNOSIS — R0602 Shortness of breath: Secondary | ICD-10-CM

## 2020-05-30 DIAGNOSIS — R059 Cough, unspecified: Secondary | ICD-10-CM

## 2020-05-30 MED ORDER — PREDNISONE 20 MG PO TABS: 40.0000 mg | ORAL_TABLET | Freq: Every day | ORAL | 0 refills | Status: DC

## 2020-05-30 MED ORDER — BENZONATATE 100 MG PO CAPS: ORAL_CAPSULE | ORAL | 0 refills | Status: DC

## 2020-05-30 NOTE — ED Triage Notes (Signed)
Patient has a congested cough.  sore in torso.  Sharp pains, in chest.   Patient reports having covid 04/23/2020 Patient denies fever.  Patient complains of wheezing.
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
## 2020-09-11 ENCOUNTER — Encounter (HOSPITAL_COMMUNITY): Payer: Self-pay | Admitting: Emergency Medicine

## 2020-09-11 ENCOUNTER — Other Ambulatory Visit: Payer: Self-pay

## 2020-09-11 ENCOUNTER — Ambulatory Visit (HOSPITAL_COMMUNITY)
Admission: EM | Admit: 2020-09-11 | Discharge: 2020-09-11 | Disposition: A | Discharge status: Home / Self Care | Payer: 59 | Attending: Student | Admitting: Student

## 2020-09-11 DIAGNOSIS — F17200 Nicotine dependence, unspecified, uncomplicated: Secondary | ICD-10-CM

## 2020-09-11 DIAGNOSIS — J441 Chronic obstructive pulmonary disease with (acute) exacerbation: Secondary | ICD-10-CM

## 2020-09-11 MED ORDER — BENZONATATE 100 MG PO CAPS: 100.0000 mg | ORAL_CAPSULE | Freq: Three times a day (TID) | ORAL | 0 refills | Status: DC

## 2020-09-11 MED ORDER — PREDNISONE 10 MG (21) PO TBPK: ORAL_TABLET | Freq: Every day | ORAL | 0 refills | Status: DC
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
## 2020-09-11 NOTE — Discharge Instructions (Addendum)
-  Prednisone taper for cough/bronchitis. I recommend taking this in the morning as it could give you energy. This is the generic version of Deltasone. -Tessalon (Benzonatate) as needed for cough. Take one pill up to 3x daily (every 8 hours) -Talk with your work about getting a new mask that we will provide you with better protection from dust. -Follow-up with primary care provider if your symptoms worsen or persist.
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
## 2020-12-19 ENCOUNTER — Encounter (HOSPITAL_COMMUNITY): Payer: Self-pay | Admitting: Emergency Medicine

## 2020-12-19 ENCOUNTER — Ambulatory Visit (INDEPENDENT_AMBULATORY_CARE_PROVIDER_SITE_OTHER): Payer: Self-pay

## 2020-12-19 ENCOUNTER — Ambulatory Visit (HOSPITAL_COMMUNITY)
Admission: EM | Admit: 2020-12-19 | Discharge: 2020-12-19 | Disposition: A | Discharge status: Home / Self Care | Payer: Self-pay | Attending: Family Medicine | Admitting: Family Medicine

## 2020-12-19 DIAGNOSIS — M25512 Pain in left shoulder: Secondary | ICD-10-CM

## 2020-12-19 DIAGNOSIS — R030 Elevated blood-pressure reading, without diagnosis of hypertension: Secondary | ICD-10-CM

## 2020-12-19 MED ORDER — CYCLOBENZAPRINE HCL 5 MG PO TABS: 5.0000 mg | ORAL_TABLET | Freq: Three times a day (TID) | ORAL | 0 refills | Status: DC | PRN

## 2020-12-19 MED ORDER — DEXAMETHASONE SODIUM PHOSPHATE 10 MG/ML IJ SOLN: 10.0000 mg | Freq: Once | INTRAMUSCULAR | Status: DC

## 2020-12-19 MED ORDER — DEXAMETHASONE SODIUM PHOSPHATE 10 MG/ML IJ SOLN
INTRAMUSCULAR | Status: AC
  Filled 2020-12-19: qty 1

## 2020-12-19 NOTE — ED Triage Notes (Signed)
Pt is present today with left shoulder pain. 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 Pt states that it is hard to move or lit his arm. Pt states that he noticed the pain Monday
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
## 2021-01-08 ENCOUNTER — Ambulatory Visit (HOSPITAL_COMMUNITY)
Admission: EM | Admit: 2021-01-08 | Discharge: 2021-01-08 | Disposition: A | Discharge status: Home / Self Care | Payer: Medicaid Other | Attending: Student | Admitting: Student

## 2021-01-08 ENCOUNTER — Other Ambulatory Visit: Payer: Self-pay

## 2021-01-08 ENCOUNTER — Encounter (HOSPITAL_COMMUNITY): Payer: Self-pay

## 2021-01-08 DIAGNOSIS — R030 Elevated blood-pressure reading, without diagnosis of hypertension: Secondary | ICD-10-CM

## 2021-01-08 DIAGNOSIS — J069 Acute upper respiratory infection, unspecified: Secondary | ICD-10-CM

## 2021-01-08 DIAGNOSIS — M25512 Pain in left shoulder: Secondary | ICD-10-CM

## 2021-01-08 DIAGNOSIS — Z760 Encounter for issue of repeat prescription: Secondary | ICD-10-CM

## 2021-01-08 MED ORDER — ALBUTEROL SULFATE HFA 108 (90 BASE) MCG/ACT IN AERS
1.0000 | INHALATION_SPRAY | Freq: Once | RESPIRATORY_TRACT | Status: AC
  Administered 2021-01-08: 1 via RESPIRATORY_TRACT

## 2021-01-08 MED ORDER — PREDNISONE 10 MG (21) PO TBPK: ORAL_TABLET | Freq: Every day | ORAL | 0 refills | Status: DC

## 2021-01-08 MED ORDER — CYCLOBENZAPRINE HCL 5 MG PO TABS: 5.0000 mg | ORAL_TABLET | Freq: Three times a day (TID) | ORAL | 0 refills | Status: DC | PRN

## 2021-01-08 MED ORDER — ALBUTEROL SULFATE HFA 108 (90 BASE) MCG/ACT IN AERS
INHALATION_SPRAY | RESPIRATORY_TRACT | Status: AC
  Filled 2021-01-08: qty 6.7

## 2021-01-08 NOTE — Discharge Instructions (Addendum)
-  Prednisone taper for cough/bronchitis. I recommend taking this in the morning as it could give you energy.  Avoid NSAIDs like ibuprofen and alleve while taking this medication as they can increase your risk of stomach upset and even GI bleeding when in combination with a steroid. You can continue tylenol (acetaminophen) up to 1000mg  3x daily. -Albuterol inhaler as needed for cough, wheezing, shortness of breath, 1 to 2 puffs every 6 hours as needed. -Tessalon (Benzonatate) as needed for cough. Take one pill up to 3x daily (every 8 hours) -Continue the cyclobenzaprine (flexeril) up to 2 times daily for muscle spasms and pain.  This can make you drowsy, so take at bedtime or when you do not need to drive or operate machinery. -Please check your blood pressure at home or at the pharmacy. If this continues to be >140/90, follow-up with your primary care provider for further blood pressure management/ medication titration. If you develop chest pain, shortness of breath, vision changes, the worst headache of your life- head straight to the ED or call 911.

## 2021-01-08 NOTE — ED Triage Notes (Signed)
Pt presents with productive cough and sore throat X 2 days. 
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
## 2021-02-08 ENCOUNTER — Ambulatory Visit (HOSPITAL_COMMUNITY)
Admission: EM | Admit: 2021-02-08 | Discharge: 2021-02-08 | Disposition: A | Discharge status: Home / Self Care | Payer: Commercial Managed Care - PPO | Attending: Emergency Medicine | Admitting: Emergency Medicine

## 2021-02-08 ENCOUNTER — Encounter (HOSPITAL_COMMUNITY): Payer: Self-pay

## 2021-02-08 ENCOUNTER — Other Ambulatory Visit: Payer: Self-pay

## 2021-02-08 DIAGNOSIS — M7551 Bursitis of right shoulder: Secondary | ICD-10-CM

## 2021-02-08 DIAGNOSIS — M7581 Other shoulder lesions, right shoulder: Secondary | ICD-10-CM

## 2021-02-08 MED ORDER — TIZANIDINE HCL 4 MG PO TABS: 4.0000 mg | ORAL_TABLET | Freq: Three times a day (TID) | ORAL | 0 refills | Status: DC | PRN

## 2021-02-08 MED ORDER — METHYLPREDNISOLONE 4 MG PO TBPK: ORAL_TABLET | Freq: Every day | ORAL | 0 refills | Status: DC

## 2021-02-08 MED ORDER — IBUPROFEN 600 MG PO TABS: 600.0000 mg | ORAL_TABLET | Freq: Four times a day (QID) | ORAL | 0 refills | Status: DC | PRN

## 2021-02-08 MED ORDER — ACETAMINOPHEN 325 MG PO TABS
ORAL_TABLET | ORAL | Status: AC
  Filled 2021-02-08: qty 3

## 2021-02-08 MED ORDER — ACETAMINOPHEN 325 MG PO TABS
975.0000 mg | ORAL_TABLET | Freq: Once | ORAL | Status: AC
  Administered 2021-02-08: 975 mg via ORAL

## 2021-02-08 MED ORDER — KETOROLAC TROMETHAMINE 30 MG/ML IJ SOLN
30.0000 mg | Freq: Once | INTRAMUSCULAR | Status: AC
  Administered 2021-02-08: 30 mg via INTRAMUSCULAR

## 2021-02-08 MED ORDER — ACETAMINOPHEN 325 MG PO TABS
ORAL_TABLET | ORAL | Status: AC
  Filled 2021-02-08: qty 1

## 2021-02-08 MED ORDER — KETOROLAC TROMETHAMINE 30 MG/ML IJ SOLN
INTRAMUSCULAR | Status: AC
  Filled 2021-02-08: qty 1

## 2021-02-08 MED ORDER — LIDOCAINE 5 % EX PTCH: 1.0000 | MEDICATED_PATCH | CUTANEOUS | 0 refills | Status: DC

## 2021-02-08 NOTE — Discharge Instructions (Addendum)
600 mg of ibuprofen combined with 1000 mg of Tylenol together 3-4 times a day as needed for pain.  I have given you your initial dose of NSAID and Tylenol here tonight.  You do not need to take anything else tonight.  Medrol Dosepak, Lidoderm patch, Zanaflex for muscle spasm. ice, heat, whichever feels better, rest.  Sleep with a pillow between your arm and body.  Follow-up with [facility] if not better with conservative treatment in 7 to 10 days.
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
## 2021-02-08 NOTE — ED Triage Notes (Signed)
Pt presents with right shoulder pain with no injury since yesterday.

## 2021-03-27 ENCOUNTER — Other Ambulatory Visit: Payer: Self-pay

## 2021-03-27 ENCOUNTER — Ambulatory Visit (INDEPENDENT_AMBULATORY_CARE_PROVIDER_SITE_OTHER): Payer: Commercial Managed Care - PPO

## 2021-03-27 ENCOUNTER — Encounter: Payer: Self-pay | Admitting: Emergency Medicine

## 2021-03-27 ENCOUNTER — Ambulatory Visit
Admission: EM | Admit: 2021-03-27 | Discharge: 2021-03-27 | Disposition: A | Discharge status: Home / Self Care | Payer: Commercial Managed Care - PPO | Attending: Physician Assistant | Admitting: Physician Assistant

## 2021-03-27 DIAGNOSIS — S3992XA Unspecified injury of lower back, initial encounter: Secondary | ICD-10-CM

## 2021-03-27 DIAGNOSIS — M545 Low back pain, unspecified: Secondary | ICD-10-CM

## 2021-03-27 MED ORDER — PREDNISONE 20 MG PO TABS: 40.0000 mg | ORAL_TABLET | Freq: Every day | ORAL | 0 refills | Status: AC

## 2021-03-27 MED ORDER — CYCLOBENZAPRINE HCL 10 MG PO TABS: 10.0000 mg | ORAL_TABLET | Freq: Two times a day (BID) | ORAL | 0 refills | Status: DC | PRN

## 2021-03-27 NOTE — ED Triage Notes (Signed)
Slipped on wet steps yesterday, landed on his lower back. Woke up today with severe back pain, pain radiating into his right buttock and leg. Denies numbness tingling down legs, denies loss of sensation in legs, denies loss of bowel or bladder functioning
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
## 2021-05-30 ENCOUNTER — Emergency Department (HOSPITAL_BASED_OUTPATIENT_CLINIC_OR_DEPARTMENT_OTHER)
Admission: EM | Admit: 2021-05-30 | Discharge: 2021-05-30 | Disposition: A | Discharge status: Home / Self Care | Payer: Commercial Managed Care - PPO | Attending: Emergency Medicine | Admitting: Emergency Medicine

## 2021-05-30 ENCOUNTER — Other Ambulatory Visit: Payer: Self-pay

## 2021-05-30 ENCOUNTER — Encounter: Payer: Self-pay | Admitting: Emergency Medicine

## 2021-05-30 ENCOUNTER — Emergency Department (HOSPITAL_BASED_OUTPATIENT_CLINIC_OR_DEPARTMENT_OTHER): Payer: Commercial Managed Care - PPO | Admitting: Radiology

## 2021-05-30 ENCOUNTER — Ambulatory Visit
Admission: EM | Admit: 2021-05-30 | Discharge: 2021-05-30 | Disposition: A | Discharge status: Home / Self Care | Payer: Commercial Managed Care - PPO

## 2021-05-30 DIAGNOSIS — I10 Essential (primary) hypertension: Secondary | ICD-10-CM | POA: Insufficient documentation

## 2021-05-30 DIAGNOSIS — Z20822 Contact with and (suspected) exposure to covid-19: Secondary | ICD-10-CM | POA: Diagnosis not present

## 2021-05-30 DIAGNOSIS — I161 Hypertensive emergency: Secondary | ICD-10-CM | POA: Diagnosis not present

## 2021-05-30 DIAGNOSIS — J069 Acute upper respiratory infection, unspecified: Secondary | ICD-10-CM

## 2021-05-30 DIAGNOSIS — Z79899 Other long term (current) drug therapy: Secondary | ICD-10-CM | POA: Diagnosis not present

## 2021-05-30 LAB — CBC
HCT: 39.4 % (ref 39.0–52.0)
Hemoglobin: 13.7 g/dL (ref 13.0–17.0)
MCH: 30.9 pg (ref 26.0–34.0)
MCHC: 34.8 g/dL (ref 30.0–36.0)
MCV: 88.9 fL (ref 80.0–100.0)
Platelets: 142 10*3/uL — ABNORMAL LOW (ref 150–400)
RBC: 4.43 MIL/uL (ref 4.22–5.81)
RDW: 13.2 % (ref 11.5–15.5)
WBC: 11 10*3/uL — ABNORMAL HIGH (ref 4.0–10.5)
nRBC: 0 % (ref 0.0–0.2)

## 2021-05-30 LAB — BASIC METABOLIC PANEL
Anion gap: 10 (ref 5–15)
BUN: 19 mg/dL (ref 6–20)
CO2: 23 mmol/L (ref 22–32)
Calcium: 9.4 mg/dL (ref 8.9–10.3)
Chloride: 105 mmol/L (ref 98–111)
Creatinine, Ser: 0.79 mg/dL (ref 0.61–1.24)
GFR, Estimated: >60 mL/min (ref >60)
Glucose, Bld: 121 mg/dL — ABNORMAL HIGH (ref 70–99)
Potassium: 3.9 mmol/L (ref 3.5–5.1)
Sodium: 138 mmol/L (ref 135–145)

## 2021-05-30 LAB — RESP PANEL BY RT-PCR (FLU A&B, COVID) ARPGX2
Influenza A by PCR: NEGATIVE
Influenza B by PCR: NEGATIVE
SARS Coronavirus 2 by RT PCR: NEGATIVE

## 2021-05-30 LAB — TROPONIN I (HIGH SENSITIVITY): Troponin I (High Sensitivity): 2 ng/L (ref <18)

## 2021-05-30 MED ORDER — BENZONATATE 100 MG PO CAPS: 100.0000 mg | ORAL_CAPSULE | Freq: Three times a day (TID) | ORAL | 0 refills | Status: DC | PRN

## 2021-05-30 MED ORDER — HYDROCHLOROTHIAZIDE 25 MG PO TABS: 25.0000 mg | ORAL_TABLET | Freq: Every day | ORAL | 0 refills | Status: DC

## 2021-05-30 MED ORDER — CETIRIZINE HCL 10 MG PO TABS: 10.0000 mg | ORAL_TABLET | Freq: Every day | ORAL | 0 refills | Status: DC

## 2021-05-30 MED ORDER — FLUTICASONE PROPIONATE 50 MCG/ACT NA SUSP: 1.0000 | Freq: Every day | NASAL | 0 refills | Status: DC

## 2021-05-30 MED ORDER — CLONIDINE HCL 0.1 MG PO TABS
0.1000 mg | ORAL_TABLET | Freq: Once | ORAL | Status: AC
  Administered 2021-05-30: 0.1 mg via ORAL
  Filled 2021-05-30: qty 1
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
## 2021-05-30 NOTE — ED Triage Notes (Signed)
Pt sent from urgent care for high blood pressure, pt states on the way here chest pain started and feels like sharp stabbing pain. Pt has had cough x 1 week.

## 2021-05-30 NOTE — Discharge Instructions (Signed)
Medications have been prescribed for your respiratory symptoms.  Please go the ER for further evaluation and management of your blood pressure.

## 2021-05-30 NOTE — Discharge Instructions (Addendum)
Call your current doctor that prescribes your blood pressure medication and asked for medication guidance for your hypertension.  You have been given referral to a cardiologist that specializes in hypertension.  Please call to schedule an appointment
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
## 2021-05-30 NOTE — ED Triage Notes (Signed)
Hacking cough since yesterday after working in bad weather

## 2021-06-05 ENCOUNTER — Ambulatory Visit: Payer: Commercial Managed Care - PPO | Admitting: Cardiovascular Disease

## 2021-06-05 ENCOUNTER — Other Ambulatory Visit: Payer: Self-pay

## 2021-06-05 ENCOUNTER — Encounter: Payer: Self-pay | Admitting: Cardiovascular Disease

## 2021-06-05 DIAGNOSIS — E782 Mixed hyperlipidemia: Secondary | ICD-10-CM | POA: Diagnosis not present

## 2021-06-05 DIAGNOSIS — I10 Essential (primary) hypertension: Secondary | ICD-10-CM | POA: Diagnosis not present

## 2021-06-05 DIAGNOSIS — E785 Hyperlipidemia, unspecified: Secondary | ICD-10-CM | POA: Insufficient documentation

## 2021-06-05 DIAGNOSIS — R072 Precordial pain: Secondary | ICD-10-CM

## 2021-06-05 MED ORDER — OLMESARTAN MEDOXOMIL 20 MG PO TABS: 20.0000 mg | ORAL_TABLET | Freq: Every day | ORAL | 3 refills | Status: DC
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
## 2021-06-05 NOTE — Assessment & Plan Note (Signed)
History of essential hypertension blood pressure measured today at 156/82.  He does check his blood pressure at home and is usually elevated.  He is on carvedilol 12.5 mg twice daily and it appears that he has been on amlodipine, lisinopril, hydrochlorothiazide and losartan as well as metoprolol in the past.  I am going to begin him on Benicar 20 mg a day.  I have asked him to keep a 30-day blood pressure log and he will see a Pharm.D. back in 4 weeks to review and make appropriate changes.

## 2021-06-05 NOTE — Patient Instructions (Signed)
Medication Instructions:   -Start olmesartan (benicar) 20mg  once daily.   *If you need a refill on your cardiac medications before your next appointment, please call your pharmacy*   Lab Work: Your physician recommends that you return for lab work in: 2 weeks for FASTING lipid/liver profile and BMET  If you have labs (blood work) drawn today and your tests are completely normal, you will receive your results only by: [place] (if you have MyChart) OR A paper copy in the mail If you have any lab test that is abnormal or we need to change your treatment, we will call you to review the results.   Testing/Procedures: Dr. Gwenlyn Found has ordered a CT coronary calcium score.   Test location:  [facility] ([address], [place] [address])   This is $99 out of pocket.   Coronary CalciumScan A coronary calcium scan is an imaging test used to look for deposits of calcium and other fatty materials (plaques) in the inner lining of the blood vessels of the heart (coronary arteries). These deposits of calcium and plaques can partly clog and narrow the coronary arteries without producing any symptoms or warning signs. This puts a person at risk for a heart attack. This test can detect these deposits before symptoms develop. Tell a health care provider about: Any allergies you have. All medicines you are taking, including vitamins, herbs, eye drops, creams, and over-the-counter medicines. Any problems you or family members have had with anesthetic medicines. Any blood disorders you have. Any surgeries you have had. Any medical conditions you have. Whether you are pregnant or may be pregnant. What are the risks? Generally, this is a safe procedure. However, problems may occur, including: Harm to a pregnant woman and her unborn baby. This test involves the use of radiation. Radiation exposure can be dangerous to a pregnant woman and her unborn baby. If you are pregnant, you  generally should not have this procedure done. Slight increase in the risk of cancer. This is because of the radiation involved in the test. What happens before the procedure? No preparation is needed for this procedure. What happens during the procedure? You will undress and remove any jewelry around your neck or chest. You will put on a hospital gown. Sticky electrodes will be placed on your chest. The electrodes will be connected to an electrocardiogram (ECG) machine to record a tracing of the electrical activity of your heart. A CT scanner will take pictures of your heart. During this time, you will be asked to lie still and hold your breath for 2-3 seconds while a picture of your heart is being taken. The procedure may vary among health care providers and hospitals. What happens after the procedure? You can get dressed. You can return to your normal activities. It is up to you to get the results of your test. Ask your health care provider, or the department that is doing the test, when your results will be ready. Summary A coronary calcium scan is an imaging test used to look for deposits of calcium and other fatty materials (plaques) in the inner lining of the blood vessels of the heart (coronary arteries). Generally, this is a safe procedure. Tell your health care provider if you are pregnant or may be pregnant. No preparation is needed for this procedure. A CT scanner will take pictures of your heart. You can return to your normal activities after the scan is done. This information is not intended to replace advice  given to you by your health care provider. Make sure you discuss any questions you have with your health care provider. Document Released: 09/27/2007 Document Revised: 02/18/2016 Document Reviewed: 02/18/2016 Elsevier Interactive Patient Education  2017 [place]: At [facility], you and your health needs are our priority.  As part of our continuing  mission to provide you with exceptional heart care, we have created designated Provider Care Teams.  These Care Teams include your primary Cardiologist (physician) and Advanced Practice Providers (APPs -  Physician Assistants and Nurse Practitioners) who all work together to provide you with the care you need, when you need it.  We recommend signing up for the patient portal called "MyChart".  Sign up information is provided on this After Visit Summary.  MyChart is used to connect with patients for Virtual Visits (Telemedicine).  Patients are able to view lab/test results, encounter notes, upcoming appointments, etc.  Non-urgent messages can be sent to your provider as well.   To learn more about what you can do with MyChart, go to [email or web address].    Your next appointment:   3 month(s)  The format for your next appointment:   In Person  Provider:   Quay Burow, MD   Other Instructions Dr. Gwenlyn Found has requested that you schedule an appointment with one of our clinical pharmacists for a blood pressure check appointment within the next 4 weeks.  If you monitor your blood pressure (BP) at home, please bring your BP cuff and your BP readings with you to this appointment  HOW TO TAKE YOUR BLOOD PRESSURE: Rest 5 minutes before taking your blood pressure. Dont smoke or drink caffeinated beverages for at least 30 minutes before. Take your blood pressure before (not after) you eat. Sit comfortably with your back supported and both feet on the floor (dont cross your legs). Elevate your arm to heart level on a table or a desk. Use the proper sized cuff. It should fit smoothly and snugly around your bare upper arm. There should be enough room to slip a fingertip under the cuff. The bottom edge of the cuff should be 1 inch above the crease of the elbow. Ideally, take 3 measurements at one sitting and record the average.

## 2021-06-05 NOTE — Assessment & Plan Note (Signed)
Patient complains of atypical chest pain mostly in his left axilla which is brief and sharp.  He has had coronary calcification on chest CTs in the past.  I am going to get a coronary calcium score to further evaluate

## 2021-06-05 NOTE — Assessment & Plan Note (Signed)
History of hyperlipidemia on fenofibrate in the past.  We will obtain a fasting lipid liver profile.

## 2021-07-01 ENCOUNTER — Ambulatory Visit: Payer: Commercial Managed Care - PPO
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
## 2021-07-02 ENCOUNTER — Other Ambulatory Visit: Payer: Self-pay

## 2021-07-02 ENCOUNTER — Ambulatory Visit
Admission: EM | Admit: 2021-07-02 | Discharge: 2021-07-02 | Disposition: A | Discharge status: Home / Self Care | Payer: Commercial Managed Care - PPO | Attending: Emergency Medicine | Admitting: Emergency Medicine

## 2021-07-02 DIAGNOSIS — M940 Chondrocostal junction syndrome [Tietze]: Secondary | ICD-10-CM

## 2021-07-02 DIAGNOSIS — I10 Essential (primary) hypertension: Secondary | ICD-10-CM

## 2021-07-02 MED ORDER — AMLODIPINE BESYLATE 5 MG PO TABS: 5.0000 mg | ORAL_TABLET | Freq: Every day | ORAL | 0 refills | Status: DC

## 2021-07-02 MED ORDER — METHYLPREDNISOLONE 4 MG PO TBPK: ORAL_TABLET | ORAL | 0 refills | Status: DC

## 2021-07-02 MED ORDER — HYDROCHLOROTHIAZIDE 25 MG PO TABS: 25.0000 mg | ORAL_TABLET | Freq: Every day | ORAL | 0 refills | Status: DC
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
## 2021-07-02 NOTE — ED Triage Notes (Signed)
Pt c/o cough, congestion, and sore lt ribs since Sunday. States using his inhaler for wheezing with some relief. ?

## 2021-07-05 LAB — HEPATIC FUNCTION PANEL
ALT: 46 IU/L — ABNORMAL HIGH (ref 0–44)
AST: 29 IU/L (ref 0–40)
Albumin: 4.5 g/dL (ref 3.8–4.9)
Alkaline Phosphatase: 74 IU/L (ref 44–121)
Bilirubin Total: 0.4 mg/dL (ref 0.0–1.2)
Bilirubin, Direct: 0.12 mg/dL (ref 0.00–0.40)
Total Protein: 7.6 g/dL (ref 6.0–8.5)

## 2021-07-05 LAB — LIPID PANEL
Chol/HDL Ratio: 3.6 ratio (ref 0.0–5.0)
Cholesterol, Total: 232 mg/dL — ABNORMAL HIGH (ref 100–199)
HDL: 65 mg/dL (ref >39)
LDL Chol Calc (NIH): 145 mg/dL — ABNORMAL HIGH (ref 0–99)
Triglycerides: 124 mg/dL (ref 0–149)
VLDL Cholesterol Cal: 22 mg/dL (ref 5–40)

## 2021-07-05 LAB — BASIC METABOLIC PANEL
BUN/Creatinine Ratio: 17 (ref 9–20)
BUN: 13 mg/dL (ref 6–24)
CO2: 24 mmol/L (ref 20–29)
Calcium: 9.6 mg/dL (ref 8.7–10.2)
Chloride: 102 mmol/L (ref 96–106)
Creatinine, Ser: 0.75 mg/dL — ABNORMAL LOW (ref 0.76–1.27)
Glucose: 106 mg/dL — ABNORMAL HIGH (ref 70–99)
Potassium: 4.9 mmol/L (ref 3.5–5.2)
Sodium: 139 mmol/L (ref 134–144)
eGFR: 105 mL/min/{1.73_m2} (ref >59)

## 2021-07-11 ENCOUNTER — Telehealth: Payer: Self-pay

## 2021-07-11 NOTE — Telephone Encounter (Signed)
Lmom pt for Missed pharmd appt 3/20 r/s ?

## 2021-07-17 ENCOUNTER — Other Ambulatory Visit: Payer: Self-pay | Admitting: Gastroenterology

## 2021-07-17 ENCOUNTER — Other Ambulatory Visit (HOSPITAL_COMMUNITY): Payer: Self-pay | Admitting: Gastroenterology

## 2021-07-17 DIAGNOSIS — R1084 Generalized abdominal pain: Secondary | ICD-10-CM

## 2021-07-19 ENCOUNTER — Ambulatory Visit (HOSPITAL_COMMUNITY)
Admission: RE | Admit: 2021-07-19 | Discharge: 2021-07-19 | Disposition: A | Discharge status: Home / Self Care | Payer: Commercial Managed Care - PPO | Source: Ambulatory Visit | Attending: Gastroenterology | Admitting: Gastroenterology

## 2021-07-19 DIAGNOSIS — R1084 Generalized abdominal pain: Secondary | ICD-10-CM | POA: Insufficient documentation

## 2021-07-19 MED ORDER — IOHEXOL 300 MG/ML  SOLN
100.0000 mL | Freq: Once | INTRAMUSCULAR | Status: AC | PRN
  Administered 2021-07-19: 100 mL via INTRAVENOUS

## 2021-07-19 MED ORDER — SODIUM CHLORIDE (PF) 0.9 % IJ SOLN
INTRAMUSCULAR | Status: AC
  Filled 2021-07-19: qty 50

## 2021-07-26 ENCOUNTER — Ambulatory Visit
Admission: RE | Admit: 2021-07-26 | Discharge: 2021-07-26 | Disposition: A | Discharge status: Home / Self Care | Payer: Self-pay | Source: Ambulatory Visit | Attending: Cardiovascular Disease | Admitting: Cardiovascular Disease

## 2021-07-26 DIAGNOSIS — E782 Mixed hyperlipidemia: Secondary | ICD-10-CM

## 2021-07-26 DIAGNOSIS — I10 Essential (primary) hypertension: Secondary | ICD-10-CM

## 2021-07-26 DIAGNOSIS — R072 Precordial pain: Secondary | ICD-10-CM

## 2021-07-29 ENCOUNTER — Telehealth: Payer: Self-pay

## 2021-07-29 DIAGNOSIS — R072 Precordial pain: Secondary | ICD-10-CM

## 2021-07-29 MED ORDER — METOPROLOL TARTRATE 100 MG PO TABS: 100.0000 mg | ORAL_TABLET | Freq: Once | ORAL | 0 refills | Status: DC

## 2021-07-29 NOTE — Telephone Encounter (Signed)
Runell Gess, MD  ?07/27/2021 11:25 AM EDT   ?  ?[facility] 1407. Please get cor CTA to further eval  ? ? ?Spoke with pt regarding coronary CTA. Pt is ok to move forward with this. Pt scheduled for 08/09/21.  ? ? ?

## 2021-07-29 NOTE — Telephone Encounter (Signed)
Attempted to call pt back to discuss instructions for CCTA. Left message for pt to call back.  ?

## 2021-08-08 ENCOUNTER — Telehealth (HOSPITAL_COMMUNITY): Payer: Self-pay | Admitting: *Deleted

## 2021-08-08 NOTE — Telephone Encounter (Signed)
Attempted to call patient regarding upcoming cardiac CT appointment. °Left message on voicemail with name and callback number ° °Tremell Reimers RN Navigator Cardiac Imaging °[facility] °[phone] Office °[phone] Cell ° °

## 2021-08-08 NOTE — Telephone Encounter (Signed)
Patient returning call regarding his cardiac CT scan appointment. He states he has a conflict at work and cannot make his appointment. New appointment made for May 12.  He is aware he needs to pick up his metoprolol tartrate for the test and that the RN navigators will give him call closer to this appointment to review instructions. ? ?Larey Brick RN Navigator Cardiac Imaging ?[facility] ?[phone] Office ?[phone] Cell  ?

## 2021-08-09 ENCOUNTER — Ambulatory Visit (HOSPITAL_COMMUNITY): Admission: RE | Admit: 2021-08-09 | Payer: Commercial Managed Care - PPO | Source: Ambulatory Visit

## 2021-08-21 ENCOUNTER — Telehealth (HOSPITAL_COMMUNITY): Payer: Self-pay | Admitting: *Deleted

## 2021-08-21 NOTE — Telephone Encounter (Signed)
Attempted to call patient regarding upcoming cardiac CT appointment. °Left message on voicemail with name and callback number ° °Yerania Chamorro RN Navigator Cardiac Imaging °[facility] °[phone] Office °[phone] Cell ° °

## 2021-08-22 ENCOUNTER — Telehealth (HOSPITAL_COMMUNITY): Payer: Self-pay | Admitting: *Deleted

## 2021-08-22 NOTE — Telephone Encounter (Signed)
Second attempt to call patient regarding his cardiac CT appointment. ?Left message on voicemail with name and callback number ? ?Derrick Brick RN Navigator Cardiac Imaging ?[facility] ?[phone] Office ?[phone] Cell ? ?

## 2021-08-23 ENCOUNTER — Ambulatory Visit (HOSPITAL_COMMUNITY)
Admission: RE | Admit: 2021-08-23 | Discharge: 2021-08-23 | Disposition: A | Discharge status: Home / Self Care | Payer: Commercial Managed Care - PPO | Source: Ambulatory Visit | Attending: Cardiovascular Disease | Admitting: Cardiovascular Disease

## 2021-08-23 ENCOUNTER — Other Ambulatory Visit: Payer: Self-pay | Admitting: Internal Medicine

## 2021-08-23 ENCOUNTER — Ambulatory Visit (HOSPITAL_COMMUNITY)
Admission: RE | Admit: 2021-08-23 | Discharge: 2021-08-23 | Disposition: A | Discharge status: Home / Self Care | Payer: Commercial Managed Care - PPO | Source: Ambulatory Visit | Attending: Internal Medicine | Admitting: Internal Medicine

## 2021-08-23 DIAGNOSIS — R931 Abnormal findings on diagnostic imaging of heart and coronary circulation: Secondary | ICD-10-CM

## 2021-08-23 DIAGNOSIS — R072 Precordial pain: Secondary | ICD-10-CM

## 2021-08-23 DIAGNOSIS — I2510 Atherosclerotic heart disease of native coronary artery without angina pectoris: Secondary | ICD-10-CM | POA: Diagnosis not present

## 2021-08-23 MED ORDER — METOPROLOL TARTRATE 5 MG/5ML IV SOLN
5.0000 mg | INTRAVENOUS | Status: DC | PRN
  Administered 2021-08-23: 5 mg via INTRAVENOUS

## 2021-08-23 MED ORDER — NITROGLYCERIN 0.4 MG SL SUBL
SUBLINGUAL_TABLET | SUBLINGUAL | Status: AC
  Filled 2021-08-23: qty 2

## 2021-08-23 MED ORDER — METOPROLOL TARTRATE 5 MG/5ML IV SOLN
INTRAVENOUS | Status: AC
Start: 2021-08-23 — End: 2021-08-23
  Administered 2021-08-23: 5 mg via INTRAVENOUS
  Filled 2021-08-23: qty 10

## 2021-08-23 MED ORDER — IOHEXOL 350 MG/ML SOLN
100.0000 mL | Freq: Once | INTRAVENOUS | Status: AC | PRN
  Administered 2021-08-23: 100 mL via INTRAVENOUS

## 2021-08-23 MED ORDER — DILTIAZEM HCL 25 MG/5ML IV SOLN
10.0000 mg | Freq: Once | INTRAVENOUS | Status: AC
  Administered 2021-08-23: 10 mg via INTRAVENOUS

## 2021-08-23 MED ORDER — DILTIAZEM HCL 25 MG/5ML IV SOLN
INTRAVENOUS | Status: AC
  Administered 2021-08-23: 10 mg via INTRAVENOUS
  Filled 2021-08-23: qty 5

## 2021-08-23 MED ORDER — NITROGLYCERIN 0.4 MG SL SUBL
0.8000 mg | SUBLINGUAL_TABLET | Freq: Once | SUBLINGUAL | Status: AC
  Administered 2021-08-23: 0.8 mg via SUBLINGUAL

## 2021-08-23 MED ORDER — DILTIAZEM HCL 25 MG/5ML IV SOLN: 10.0000 mg | Freq: Once | INTRAVENOUS | Status: AC

## 2021-08-23 NOTE — Progress Notes (Unsigned)
Please send CT for FFR - Dr. Ricke Kimoto 

## 2021-08-30 ENCOUNTER — Encounter: Payer: Self-pay | Admitting: Cardiovascular Disease

## 2021-08-30 ENCOUNTER — Ambulatory Visit: Payer: Commercial Managed Care - PPO | Admitting: Cardiovascular Disease

## 2021-08-30 DIAGNOSIS — I10 Essential (primary) hypertension: Secondary | ICD-10-CM | POA: Diagnosis not present

## 2021-08-30 DIAGNOSIS — R072 Precordial pain: Secondary | ICD-10-CM | POA: Diagnosis not present

## 2021-08-30 DIAGNOSIS — E782 Mixed hyperlipidemia: Secondary | ICD-10-CM | POA: Diagnosis not present

## 2021-08-30 MED ORDER — ISOSORBIDE MONONITRATE ER 30 MG PO TB24: 15.0000 mg | ORAL_TABLET | Freq: Every day | ORAL | 3 refills | Status: DC

## 2021-08-30 MED ORDER — CARVEDILOL 12.5 MG PO TABS: 18.7500 mg | ORAL_TABLET | Freq: Two times a day (BID) | ORAL | 3 refills | Status: DC

## 2021-08-30 NOTE — Assessment & Plan Note (Signed)
History of essential hypertension blood pressure measured today 140/90.  He is on carvedilol, amlodipine and Benicar.  I am going to uptitrate his carvedilol from 12.5 to 18.75 mg p.o. twice daily.
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
## 2021-08-30 NOTE — Assessment & Plan Note (Signed)
History of hyperlipidemia intolerant to statins in the past with lipid profile performed 07/01/2021 revealing total cholesterol 232, LDL 145 and HDL 65.  I am going to have him see one of our Pharm.D.'s to initiate PCSK9 therapy.  LDL goal of less than 70.

## 2021-08-30 NOTE — Assessment & Plan Note (Signed)
History of atypical chest pain with recent coronary CTA revealing coronary calcium score of 1407 with what appears to be a physiologically significant lesion in the mid circumflex after an obtuse marginal branch and a small vessel.  His pain is described as a sharp stabbing pain in his axilla that last for seconds at a time.  I am not convinced that this is anginal.  I am going to titrate his beta-blocker and begin him on low-dose oral long-acting nitrate.

## 2021-08-30 NOTE — Patient Instructions (Signed)
Medication Instructions:   -Increase carvedilol (coreg) to 18.75mg  twice daily.  -Start isosorbide mononitrate (imdur) 15mg  once daily.  *If you need a refill on your cardiac medications before your next appointment, please call your pharmacy*   Follow-Up: At [facility], you and your health needs are our priority.  As part of our continuing mission to provide you with exceptional heart care, we have created designated Provider Care Teams.  These Care Teams include your primary Cardiologist (physician) and Advanced Practice Providers (APPs -  Physician Assistants and Nurse Practitioners) who all work together to provide you with the care you need, when you need it.  We recommend signing up for the patient portal called "MyChart".  Sign up information is provided on this After Visit Summary.  MyChart is used to connect with patients for Virtual Visits (Telemedicine).  Patients are able to view lab/test results, encounter notes, upcoming appointments, etc.  Non-urgent messages can be sent to your provider as well.   To learn more about what you can do with MyChart, go to [facility].    Your next appointment:   3 month(s)  The format for your next appointment:   In Person  Provider:   [email or web address], MD   Other Instructions Pt needs appointment with PharmD to discuss PCSK9.

## 2021-09-06 ENCOUNTER — Ambulatory Visit: Payer: Commercial Managed Care - PPO
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
## 2021-09-13 ENCOUNTER — Ambulatory Visit: Payer: Commercial Managed Care - PPO | Admitting: Podiatry

## 2021-10-13 ENCOUNTER — Other Ambulatory Visit: Payer: Self-pay

## 2021-10-13 ENCOUNTER — Emergency Department (HOSPITAL_COMMUNITY)
Admission: EM | Admit: 2021-10-13 | Discharge: 2021-10-14 | Disposition: A | Discharge status: Home / Self Care | Payer: Commercial Managed Care - PPO | Attending: Emergency Medicine | Admitting: Emergency Medicine

## 2021-10-13 ENCOUNTER — Emergency Department (HOSPITAL_COMMUNITY): Payer: Commercial Managed Care - PPO

## 2021-10-13 DIAGNOSIS — I10 Essential (primary) hypertension: Secondary | ICD-10-CM | POA: Insufficient documentation

## 2021-10-13 DIAGNOSIS — Z79899 Other long term (current) drug therapy: Secondary | ICD-10-CM | POA: Diagnosis not present

## 2021-10-13 DIAGNOSIS — J449 Chronic obstructive pulmonary disease, unspecified: Secondary | ICD-10-CM | POA: Diagnosis not present

## 2021-10-13 DIAGNOSIS — Z87891 Personal history of nicotine dependence: Secondary | ICD-10-CM | POA: Insufficient documentation

## 2021-10-13 DIAGNOSIS — Z7951 Long term (current) use of inhaled steroids: Secondary | ICD-10-CM | POA: Insufficient documentation

## 2021-10-13 DIAGNOSIS — R059 Cough, unspecified: Secondary | ICD-10-CM | POA: Diagnosis present

## 2021-10-13 DIAGNOSIS — U071 COVID-19: Secondary | ICD-10-CM | POA: Diagnosis not present

## 2021-10-13 LAB — TROPONIN I (HIGH SENSITIVITY)
Troponin I (High Sensitivity): 7 ng/L (ref <18)
Troponin I (High Sensitivity): 6 ng/L (ref <18)

## 2021-10-13 LAB — BASIC METABOLIC PANEL
Anion gap: 12 (ref 5–15)
BUN: 11 mg/dL (ref 6–20)
CO2: 22 mmol/L (ref 22–32)
Calcium: 9 mg/dL (ref 8.9–10.3)
Chloride: 100 mmol/L (ref 98–111)
Creatinine, Ser: 0.94 mg/dL (ref 0.61–1.24)
GFR, Estimated: >60 mL/min (ref >60)
Glucose, Bld: 96 mg/dL (ref 70–99)
Potassium: 3.8 mmol/L (ref 3.5–5.1)
Sodium: 134 mmol/L — ABNORMAL LOW (ref 135–145)

## 2021-10-13 LAB — CBC WITH DIFFERENTIAL/PLATELET
Abs Immature Granulocytes: 0.07 10*3/uL (ref 0.00–0.07)
Basophils Absolute: 0 10*3/uL (ref 0.0–0.1)
Basophils Relative: 0 %
Eosinophils Absolute: 0.2 10*3/uL (ref 0.0–0.5)
Eosinophils Relative: 2 %
HCT: 39.2 % (ref 39.0–52.0)
Hemoglobin: 13.9 g/dL (ref 13.0–17.0)
Immature Granulocytes: 1 %
Lymphocytes Relative: 13 %
Lymphs Abs: 1.2 10*3/uL (ref 0.7–4.0)
MCH: 31.7 pg (ref 26.0–34.0)
MCHC: 35.5 g/dL (ref 30.0–36.0)
MCV: 89.5 fL (ref 80.0–100.0)
Monocytes Absolute: 1.1 10*3/uL — ABNORMAL HIGH (ref 0.1–1.0)
Monocytes Relative: 11 %
Neutro Abs: 6.8 10*3/uL (ref 1.7–7.7)
Neutrophils Relative %: 73 %
Platelets: 94 10*3/uL — ABNORMAL LOW (ref 150–400)
RBC: 4.38 MIL/uL (ref 4.22–5.81)
RDW: 13 % (ref 11.5–15.5)
WBC: 9.4 10*3/uL (ref 4.0–10.5)
nRBC: 0.2 % (ref 0.0–0.2)

## 2021-10-13 LAB — RESP PANEL BY RT-PCR (FLU A&B, COVID) ARPGX2
Influenza A by PCR: NEGATIVE
Influenza B by PCR: NEGATIVE
SARS Coronavirus 2 by RT PCR: POSITIVE — AB

## 2021-10-13 LAB — GROUP A STREP BY PCR: Group A Strep by PCR: NOT DETECTED

## 2021-10-13 MED ORDER — NIRMATRELVIR/RITONAVIR (PAXLOVID)TABLET: 3.0000 | ORAL_TABLET | Freq: Two times a day (BID) | ORAL | 0 refills | Status: AC

## 2021-10-13 MED ORDER — ACETAMINOPHEN 325 MG PO TABS
650.0000 mg | ORAL_TABLET | Freq: Once | ORAL | Status: AC | PRN
  Administered 2021-10-13: 650 mg via ORAL
  Filled 2021-10-13: qty 2

## 2021-10-13 NOTE — ED Notes (Signed)
Dr. Zackowski at bedside  
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
## 2021-10-13 NOTE — Discharge Instructions (Addendum)
COVID testing positive.  Strep test was negative.  No strep throat.  But COVID is explaining her symptoms.  As we discussed over-the-counter symptomatic treatment.  Prescription given for Paxlovid and oral antiviral against COVID.  Take this to pharmacy.  It is possible that if you are still taking the Lipitor that this may be a contraindication to taking it.  Discussed this with your pharmacist.  Work note provided.  In addition you can still take over-the-counter cold and flu medicines.

## 2021-10-13 NOTE — ED Notes (Signed)
Pt to xray

## 2021-10-13 NOTE — ED Triage Notes (Signed)
Pt here for cough, sore throat, body aches, chills since midnight last night. Pt has hx of copd and used breathing tx before he came in, denies shob. States he has had productive cough, believes he may have bronchitis.
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
## 2021-10-22 ENCOUNTER — Institutional Professional Consult (permissible substitution): Payer: Commercial Managed Care - PPO | Admitting: Student
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
## 2021-11-06 ENCOUNTER — Institutional Professional Consult (permissible substitution): Payer: Commercial Managed Care - PPO | Admitting: Student

## 2021-11-08 ENCOUNTER — Institutional Professional Consult (permissible substitution): Payer: Commercial Managed Care - PPO | Admitting: Pulmonary Disease

## 2021-11-25 ENCOUNTER — Emergency Department (HOSPITAL_COMMUNITY)
Admission: EM | Admit: 2021-11-25 | Discharge: 2021-11-25 | Disposition: A | Discharge status: Home / Self Care | Payer: Commercial Managed Care - PPO | Attending: Emergency Medicine | Admitting: Emergency Medicine

## 2021-11-25 ENCOUNTER — Emergency Department (HOSPITAL_COMMUNITY): Payer: Commercial Managed Care - PPO

## 2021-11-25 ENCOUNTER — Other Ambulatory Visit: Payer: Self-pay

## 2021-11-25 ENCOUNTER — Encounter (HOSPITAL_COMMUNITY): Payer: Self-pay | Admitting: Emergency Medicine

## 2021-11-25 DIAGNOSIS — Z79899 Other long term (current) drug therapy: Secondary | ICD-10-CM | POA: Insufficient documentation

## 2021-11-25 DIAGNOSIS — K6389 Other specified diseases of intestine: Secondary | ICD-10-CM | POA: Diagnosis not present

## 2021-11-25 DIAGNOSIS — R1031 Right lower quadrant pain: Secondary | ICD-10-CM | POA: Diagnosis present

## 2021-11-25 DIAGNOSIS — I10 Essential (primary) hypertension: Secondary | ICD-10-CM | POA: Diagnosis not present

## 2021-11-25 LAB — COMPREHENSIVE METABOLIC PANEL
ALT: 26 U/L (ref 0–44)
AST: 20 U/L (ref 15–41)
Albumin: 3.8 g/dL (ref 3.5–5.0)
Alkaline Phosphatase: 70 U/L (ref 38–126)
Anion gap: 6 (ref 5–15)
BUN: 16 mg/dL (ref 6–20)
CO2: 23 mmol/L (ref 22–32)
Calcium: 9.2 mg/dL (ref 8.9–10.3)
Chloride: 111 mmol/L (ref 98–111)
Creatinine, Ser: 0.86 mg/dL (ref 0.61–1.24)
GFR, Estimated: >60 mL/min (ref >60)
Glucose, Bld: 91 mg/dL (ref 70–99)
Potassium: 4 mmol/L (ref 3.5–5.1)
Sodium: 140 mmol/L (ref 135–145)
Total Bilirubin: 1.1 mg/dL (ref 0.3–1.2)
Total Protein: 7.3 g/dL (ref 6.5–8.1)

## 2021-11-25 LAB — CBC WITH DIFFERENTIAL/PLATELET
Abs Immature Granulocytes: 0.02 10*3/uL (ref 0.00–0.07)
Basophils Absolute: 0.1 10*3/uL (ref 0.0–0.1)
Basophils Relative: 1 %
Eosinophils Absolute: 0.2 10*3/uL (ref 0.0–0.5)
Eosinophils Relative: 2 %
HCT: 39.6 % (ref 39.0–52.0)
Hemoglobin: 13.9 g/dL (ref 13.0–17.0)
Immature Granulocytes: 0 %
Lymphocytes Relative: 28 %
Lymphs Abs: 2.2 10*3/uL (ref 0.7–4.0)
MCH: 31.4 pg (ref 26.0–34.0)
MCHC: 35.1 g/dL (ref 30.0–36.0)
MCV: 89.4 fL (ref 80.0–100.0)
Monocytes Absolute: 0.6 10*3/uL (ref 0.1–1.0)
Monocytes Relative: 7 %
Neutro Abs: 4.8 10*3/uL (ref 1.7–7.7)
Neutrophils Relative %: 62 %
Platelets: 96 10*3/uL — ABNORMAL LOW (ref 150–400)
RBC: 4.43 MIL/uL (ref 4.22–5.81)
RDW: 13.6 % (ref 11.5–15.5)
WBC: 7.9 10*3/uL (ref 4.0–10.5)
nRBC: 0 % (ref 0.0–0.2)

## 2021-11-25 LAB — URINALYSIS, ROUTINE W REFLEX MICROSCOPIC
Bilirubin Urine: NEGATIVE
Glucose, UA: NEGATIVE mg/dL
Ketones, ur: NEGATIVE mg/dL
Leukocytes,Ua: NEGATIVE
Nitrite: NEGATIVE
Protein, ur: NEGATIVE mg/dL
Specific Gravity, Urine: 1.017 (ref 1.005–1.030)
pH: 5 (ref 5.0–8.0)

## 2021-11-25 LAB — LIPASE, BLOOD: Lipase: 45 U/L (ref 11–51)

## 2021-11-25 MED ORDER — HYDROMORPHONE HCL 1 MG/ML IJ SOLN
1.0000 mg | Freq: Once | INTRAMUSCULAR | Status: AC
  Administered 2021-11-25: 1 mg via INTRAVENOUS
  Filled 2021-11-25: qty 1

## 2021-11-25 MED ORDER — SODIUM CHLORIDE 0.9 % IV BOLUS
1000.0000 mL | Freq: Once | INTRAVENOUS | Status: AC
  Administered 2021-11-25: 1000 mL via INTRAVENOUS

## 2021-11-25 MED ORDER — SODIUM CHLORIDE (PF) 0.9 % IJ SOLN
INTRAMUSCULAR | Status: AC
  Filled 2021-11-25: qty 50

## 2021-11-25 MED ORDER — IOHEXOL 300 MG/ML  SOLN
100.0000 mL | Freq: Once | INTRAMUSCULAR | Status: AC | PRN
  Administered 2021-11-25: 100 mL via INTRAVENOUS

## 2021-11-25 NOTE — ED Notes (Signed)
Labeled urine specimen and culture sent to lab. ENMiles 

## 2021-11-25 NOTE — Discharge Instructions (Addendum)
Your CT scan shows a polyp versus a mass in your jejunum, which is a part of your small bowel.  This may or may not be causing your symptoms today.  Either way, you will need to urgently follow-up with [facility] gastroenterology.  Call the office tomorrow and they will help set up an appointment for you next week.  If you develop worsening, continued, or recurrent abdominal pain, uncontrolled vomiting, fever, chest or back pain,rectal bleeding or any other new/concerning symptoms then return to the ER for evaluation.

## 2021-11-25 NOTE — ED Triage Notes (Signed)
Pt reports abd pain since this morning & reports bright red blood in stools.

## 2021-11-25 NOTE — ED Notes (Signed)
Pt states understanding of dc instructions, importance of follow up, and work note. Pt denies questions or concerns upon dc. Pt declined wheelchair assistance upon dc. Pt ambulated out of ed w/ steady gait. No belongings left in room upon dc.  
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
## 2021-11-27 ENCOUNTER — Emergency Department (HOSPITAL_COMMUNITY)
Admission: EM | Admit: 2021-11-27 | Discharge: 2021-11-27 | Discharge status: Against Medical Advice | Payer: Commercial Managed Care - PPO

## 2021-12-10 ENCOUNTER — Encounter: Payer: Self-pay | Admitting: Emergency Medicine

## 2021-12-10 ENCOUNTER — Emergency Department
Admission: EM | Admit: 2021-12-10 | Discharge: 2021-12-10 | Disposition: A | Discharge status: Home / Self Care | Payer: Commercial Managed Care - PPO | Attending: Emergency Medicine | Admitting: Emergency Medicine

## 2021-12-10 ENCOUNTER — Emergency Department: Payer: Commercial Managed Care - PPO

## 2021-12-10 ENCOUNTER — Other Ambulatory Visit: Payer: Self-pay

## 2021-12-10 DIAGNOSIS — I10 Essential (primary) hypertension: Secondary | ICD-10-CM | POA: Diagnosis not present

## 2021-12-10 DIAGNOSIS — R0789 Other chest pain: Secondary | ICD-10-CM | POA: Diagnosis present

## 2021-12-10 DIAGNOSIS — R079 Chest pain, unspecified: Secondary | ICD-10-CM

## 2021-12-10 LAB — CBC
HCT: 42.5 % (ref 39.0–52.0)
Hemoglobin: 14.7 g/dL (ref 13.0–17.0)
MCH: 30.6 pg (ref 26.0–34.0)
MCHC: 34.6 g/dL (ref 30.0–36.0)
MCV: 88.4 fL (ref 80.0–100.0)
Platelets: 169 10*3/uL (ref 150–400)
RBC: 4.81 MIL/uL (ref 4.22–5.81)
RDW: 12.6 % (ref 11.5–15.5)
WBC: 8.4 10*3/uL (ref 4.0–10.5)
nRBC: 0 % (ref 0.0–0.2)

## 2021-12-10 LAB — BASIC METABOLIC PANEL
Anion gap: 7 (ref 5–15)
BUN: 10 mg/dL (ref 6–20)
CO2: 25 mmol/L (ref 22–32)
Calcium: 9.2 mg/dL (ref 8.9–10.3)
Chloride: 107 mmol/L (ref 98–111)
Creatinine, Ser: 0.76 mg/dL (ref 0.61–1.24)
GFR, Estimated: >60 mL/min (ref >60)
Glucose, Bld: 111 mg/dL — ABNORMAL HIGH (ref 70–99)
Potassium: 3.4 mmol/L — ABNORMAL LOW (ref 3.5–5.1)
Sodium: 139 mmol/L (ref 135–145)

## 2021-12-10 LAB — TROPONIN I (HIGH SENSITIVITY): Troponin I (High Sensitivity): 4 ng/L (ref <18)

## 2021-12-10 MED ORDER — TRAZODONE HCL 50 MG PO TABS
50.0000 mg | ORAL_TABLET | Freq: Every day | ORAL | 1 refills | Status: DC
Start: 2021-12-10 — End: 2022-01-31

## 2021-12-10 MED ORDER — CLONIDINE HCL 0.1 MG PO TABS
0.1000 mg | ORAL_TABLET | Freq: Once | ORAL | Status: AC
  Administered 2021-12-10: 0.1 mg via ORAL
  Filled 2021-12-10: qty 1

## 2021-12-10 MED ORDER — CLONIDINE HCL 0.1 MG PO TABS: ORAL_TABLET | ORAL | 0 refills | Status: DC
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
## 2021-12-10 NOTE — ED Triage Notes (Signed)
Patient ambulatory to triage with steady gait, without difficulty or distress noted; pt reports HTN and pain to left side of chest and arm since yesterday

## 2021-12-10 NOTE — ED Notes (Signed)
See triage note  Presents with some chest pain and left arm pain  states pain started yesterday Denies any fever or cough

## 2021-12-11 ENCOUNTER — Ambulatory Visit: Payer: Commercial Managed Care - PPO | Admitting: Cardiovascular Disease

## 2022-01-06 ENCOUNTER — Other Ambulatory Visit: Payer: Self-pay | Admitting: Physician Assistant

## 2022-01-06 DIAGNOSIS — R1011 Right upper quadrant pain: Secondary | ICD-10-CM

## 2022-01-13 ENCOUNTER — Ambulatory Visit
Admission: RE | Admit: 2022-01-13 | Discharge: 2022-01-13 | Disposition: A | Discharge status: Home / Self Care | Payer: Commercial Managed Care - PPO | Source: Ambulatory Visit | Attending: Physician Assistant | Admitting: Physician Assistant

## 2022-01-13 ENCOUNTER — Encounter: Payer: Self-pay | Admitting: Physician Assistant

## 2022-01-13 DIAGNOSIS — R1011 Right upper quadrant pain: Secondary | ICD-10-CM
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
## 2022-01-24 ENCOUNTER — Ambulatory Visit (INDEPENDENT_AMBULATORY_CARE_PROVIDER_SITE_OTHER): Payer: Commercial Managed Care - PPO | Admitting: Student

## 2022-01-24 ENCOUNTER — Encounter: Payer: Self-pay | Admitting: Student

## 2022-01-24 VITALS — BP 162/100 | HR 80 | Temp 97.9°F | Ht 69.0 in | Wt 199.0 lb

## 2022-01-24 DIAGNOSIS — F17200 Nicotine dependence, unspecified, uncomplicated: Secondary | ICD-10-CM | POA: Diagnosis not present

## 2022-01-24 DIAGNOSIS — R053 Chronic cough: Secondary | ICD-10-CM

## 2022-01-24 MED ORDER — ALBUTEROL SULFATE HFA 108 (90 BASE) MCG/ACT IN AERS: 2.0000 | INHALATION_SPRAY | Freq: Four times a day (QID) | RESPIRATORY_TRACT | 11 refills | Status: DC | PRN

## 2022-01-24 MED ORDER — TRELEGY ELLIPTA 200-62.5-25 MCG/ACT IN AEPB: 28.0000 | INHALATION_SPRAY | Freq: Every day | RESPIRATORY_TRACT | 0 refills | Status: DC

## 2022-01-24 MED ORDER — NICOTINE 21 MG/24HR TD PT24
21.0000 mg | MEDICATED_PATCH | Freq: Every day | TRANSDERMAL | 0 refills | Status: DC
Start: 2022-01-24 — End: 2023-04-29

## 2022-01-24 MED ORDER — NICOTINE 14 MG/24HR TD PT24
14.0000 mg | MEDICATED_PATCH | Freq: Every day | TRANSDERMAL | 0 refills | Status: DC
Start: 2022-02-21 — End: 2023-04-29

## 2022-01-24 NOTE — Patient Instructions (Addendum)
-   Start trelegy 1 puff once daily, rinse mouth/gargle after use  - PFTs will be on or near same day as next clinic visit in 8 weeks. Need to stop trelegy for 3 weeks before PFTs. - albuterol 1-2 puffs as needed - this is your rescue inhaler - prescription for patches for smoking cessation ordered today - referral made to lung cancer screening program - see you in 8 weeks

## 2022-01-30 ENCOUNTER — Other Ambulatory Visit: Payer: Self-pay | Admitting: Internal Medicine

## 2022-01-30 DIAGNOSIS — R1031 Right lower quadrant pain: Secondary | ICD-10-CM

## 2022-01-30 DIAGNOSIS — R935 Abnormal findings on diagnostic imaging of other abdominal regions, including retroperitoneum: Secondary | ICD-10-CM

## 2022-01-31 ENCOUNTER — Encounter: Payer: Self-pay | Admitting: Cardiovascular Disease

## 2022-01-31 ENCOUNTER — Ambulatory Visit: Payer: Commercial Managed Care - PPO | Attending: Cardiovascular Disease | Admitting: Cardiovascular Disease

## 2022-01-31 DIAGNOSIS — E782 Mixed hyperlipidemia: Secondary | ICD-10-CM

## 2022-01-31 DIAGNOSIS — I10 Essential (primary) hypertension: Secondary | ICD-10-CM | POA: Diagnosis not present

## 2022-01-31 DIAGNOSIS — R072 Precordial pain: Secondary | ICD-10-CM

## 2022-01-31 MED ORDER — OLMESARTAN MEDOXOMIL 20 MG PO TABS: 20.0000 mg | ORAL_TABLET | Freq: Every day | ORAL | 3 refills | Status: DC

## 2022-01-31 NOTE — Assessment & Plan Note (Signed)
History of essential hypertension blood pressure measured today at 160/100.  He is on carvedilol and olmesartan.  He had been on lisinopril and losartan in the past as well as amlodipine which she did not tolerate.  He is currently out of his olmesartan.  I have asked him to renew his prescription and keep a 30-day blood pressure log.  He will see a Pharm.D. back in 4 weeks to review and make medication modifications.

## 2022-01-31 NOTE — Patient Instructions (Addendum)
Medication Instructions:  Your physician recommends that you continue on your current medications as directed. Please refer to the Current Medication list given to you today.  *If you need a refill on your cardiac medications before your next appointment, please call your pharmacy*   Testing/Procedures: Your physician has requested that you have a renal artery duplex. During this test, an ultrasound is used to evaluate blood flow to the kidneys. Allow one hour for this exam. Do not eat after midnight the day before and avoid carbonated beverages. Take your medications as you usually do. This procedure will be done at [address]    Follow-Up: At [facility], you and your health needs are our priority.  As part of our continuing mission to provide you with exceptional heart care, we have created designated Provider Care Teams.  These Care Teams include your primary Cardiologist (physician) and Advanced Practice Providers (APPs -  Physician Assistants and Nurse Practitioners) who all work together to provide you with the care you need, when you need it.  We recommend signing up for the patient portal called "MyChart".  Sign up information is provided on this After Visit Summary.  MyChart is used to connect with patients for Virtual Visits (Telemedicine).  Patients are able to view lab/test results, encounter notes, upcoming appointments, etc.  Non-urgent messages can be sent to your provider as well.   To learn more about what you can do with MyChart, go to [email or web address].    Your next appointment:   3 month(s)  The format for your next appointment:   In Person  Provider:   Fabian Sharp, PA-C, Sande Rives, PA-C, Jory Sims, DNP, ANP, Almyra Deforest, PA-C, or Diona Browner, NP    Then, Quay Burow, MD will plan to see you again in 6 month(s).    Other Instructions Dr. Gwenlyn Found has requested that you schedule an appointment with one of our clinical  pharmacists for a blood pressure check appointment within the next 4 weeks.  If you monitor your blood pressure (BP) at home, please bring your BP cuff and your BP readings with you to this appointment  HOW TO TAKE YOUR BLOOD PRESSURE: Rest 5 minutes before taking your blood pressure. Don't smoke or drink caffeinated beverages for at least 30 minutes before. Take your blood pressure before (not after) you eat. Sit comfortably with your back supported and both feet on the floor (don't cross your legs). Elevate your arm to heart level on a table or a desk. Use the proper sized cuff. It should fit smoothly and snugly around your bare upper arm. There should be enough room to slip a fingertip under the cuff. The bottom edge of the cuff should be 1 inch above the crease of the elbow. Ideally, take 3 measurements at one sitting and record the average.

## 2022-01-31 NOTE — Assessment & Plan Note (Signed)
History of occasional chest pain occurring once a week on long-acting nitrates.  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  His coronary calcium score was elevated at 1407.
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
## 2022-01-31 NOTE — Assessment & Plan Note (Signed)
History of hyperlipidemia intolerant to statin therapy.  His last lipid profile performed 07/01/2021 revealed total cholesterol 232, LDL 145 and HDL of 65.  He may be a candidate for a PCSK9.  I am referring him to one of our Pharm.D.'s to review .

## 2022-02-14 ENCOUNTER — Inpatient Hospital Stay (HOSPITAL_COMMUNITY): Admission: RE | Admit: 2022-02-14 | Payer: Commercial Managed Care - PPO | Source: Ambulatory Visit

## 2022-02-19 ENCOUNTER — Other Ambulatory Visit: Payer: Commercial Managed Care - PPO

## 2022-03-14 ENCOUNTER — Ambulatory Visit: Payer: 59

## 2022-03-21 ENCOUNTER — Other Ambulatory Visit: Payer: Commercial Managed Care - PPO
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
## 2022-03-26 ENCOUNTER — Ambulatory Visit: Payer: Commercial Managed Care - PPO | Admitting: Student
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
## 2022-05-09 ENCOUNTER — Ambulatory Visit: Payer: 59 | Admitting: Nurse Practitioner

## 2022-05-09 DIAGNOSIS — E785 Hyperlipidemia, unspecified: Secondary | ICD-10-CM

## 2022-05-09 DIAGNOSIS — Z720 Tobacco use: Secondary | ICD-10-CM

## 2022-05-09 DIAGNOSIS — I25118 Atherosclerotic heart disease of native coronary artery with other forms of angina pectoris: Secondary | ICD-10-CM

## 2022-05-09 DIAGNOSIS — I10 Essential (primary) hypertension: Secondary | ICD-10-CM

## 2022-05-26 DIAGNOSIS — M5412 Radiculopathy, cervical region: Secondary | ICD-10-CM | POA: Insufficient documentation

## 2022-07-04 ENCOUNTER — Other Ambulatory Visit: Payer: Self-pay | Admitting: Sports Medicine

## 2022-07-04 DIAGNOSIS — M5451 Vertebrogenic low back pain: Secondary | ICD-10-CM

## 2022-07-28 ENCOUNTER — Emergency Department (HOSPITAL_BASED_OUTPATIENT_CLINIC_OR_DEPARTMENT_OTHER)
Admission: EM | Admit: 2022-07-28 | Discharge: 2022-07-28 | Disposition: A | Discharge status: Home / Self Care | Payer: Commercial Managed Care - PPO | Attending: Emergency Medicine | Admitting: Emergency Medicine

## 2022-07-28 ENCOUNTER — Encounter (HOSPITAL_BASED_OUTPATIENT_CLINIC_OR_DEPARTMENT_OTHER): Payer: Self-pay | Admitting: Emergency Medicine

## 2022-07-28 ENCOUNTER — Ambulatory Visit
Admission: EM | Admit: 2022-07-28 | Discharge: 2022-07-28 | Disposition: A | Discharge status: Home / Self Care | Payer: Commercial Managed Care - PPO | Attending: Nurse Practitioner | Admitting: Nurse Practitioner

## 2022-07-28 ENCOUNTER — Other Ambulatory Visit: Payer: Self-pay

## 2022-07-28 DIAGNOSIS — R059 Cough, unspecified: Secondary | ICD-10-CM | POA: Insufficient documentation

## 2022-07-28 DIAGNOSIS — I10 Essential (primary) hypertension: Secondary | ICD-10-CM

## 2022-07-28 DIAGNOSIS — H66002 Acute suppurative otitis media without spontaneous rupture of ear drum, left ear: Secondary | ICD-10-CM

## 2022-07-28 DIAGNOSIS — H9202 Otalgia, left ear: Secondary | ICD-10-CM | POA: Insufficient documentation

## 2022-07-28 DIAGNOSIS — F17200 Nicotine dependence, unspecified, uncomplicated: Secondary | ICD-10-CM | POA: Insufficient documentation

## 2022-07-28 DIAGNOSIS — R0981 Nasal congestion: Secondary | ICD-10-CM | POA: Diagnosis not present

## 2022-07-28 MED ORDER — AMOXICILLIN 500 MG PO CAPS: 1000.0000 mg | ORAL_CAPSULE | Freq: Two times a day (BID) | ORAL | 0 refills | Status: DC

## 2022-07-28 MED ORDER — AEROCHAMBER PLUS FLO-VU MISC
1.0000 | Freq: Once | Status: AC
  Administered 2022-07-28: 1
  Filled 2022-07-28: qty 1

## 2022-07-28 MED ORDER — ALBUTEROL SULFATE HFA 108 (90 BASE) MCG/ACT IN AERS
2.0000 | INHALATION_SPRAY | Freq: Once | RESPIRATORY_TRACT | Status: AC
  Administered 2022-07-28: 2 via RESPIRATORY_TRACT
  Filled 2022-07-28: qty 6.7

## 2022-07-28 MED ORDER — DEXAMETHASONE 4 MG PO TABS
10.0000 mg | ORAL_TABLET | Freq: Once | ORAL | Status: AC
  Administered 2022-07-28: 10 mg via ORAL
  Filled 2022-07-28: qty 3

## 2022-07-28 MED ORDER — AMOXICILLIN 500 MG PO CAPS
1000.0000 mg | ORAL_CAPSULE | Freq: Once | ORAL | Status: AC
  Administered 2022-07-28: 1000 mg via ORAL
  Filled 2022-07-28: qty 2

## 2022-07-28 NOTE — ED Notes (Signed)
Patient is being discharged from the Urgent Care and sent to the Emergency Department via POV . Per Lennox Laity, NP, patient is in need of higher level of care due to HTN. Patient is aware and verbalizes understanding of plan of care.  Vitals:   07/28/22 1008  BP: (!) 200/128  Pulse: 88  Resp: 18  Temp: 98 F (36.7 C)  SpO2: 95%
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
## 2022-07-28 NOTE — ED Provider Notes (Signed)
[place] [facility] AT [facility] Provider Note   CSN: [id number] Arrival date & time: 07/28/22  1118     History  Chief Complaint  Patient presents with   Hypertension    Derrick Watson is a 57 y.o. male.  57 yo M with a chief complaints of cough congestion and sore throat.  This been going on for a few days now.  He went to urgent care and they were worried about his blood pressure and sent him here for evaluation.  He said he has had some left-sided headaches and feels like his ear is ringing on that side.  No known sick contacts.   Hypertension       Home Medications Prior to Admission medications   Medication Sig Start Date End Date Taking? Authorizing Provider  amoxicillin (AMOXIL) 500 MG capsule Take 2 capsules (1,000 mg total) by mouth 2 (two) times daily. 07/28/22  Yes Melene Plan, DO  albuterol (PROAIR HFA) 108 (90 Base) MCG/ACT inhaler 2 puff(s) inhaled every 6 hours for 30 day(s)    [provider]  albuterol (VENTOLIN HFA) 108 (90 Base) MCG/ACT inhaler Inhale 2 puffs into the lungs every 6 (six) hours as needed for wheezing or shortness of breath. 01/24/22   Omar Person, MD  Fluticasone-Umeclidin-Vilant (TRELEGY ELLIPTA) 200-62.5-25 MCG/ACT AEPB Inhale 28 each into the lungs daily. 01/24/22   Omar Person, MD  nicotine (NICODERM CQ - DOSED IN MG/24 HOURS) 14 mg/24hr patch Place 1 patch (14 mg total) onto the skin daily. 02/21/22   Omar Person, MD  nicotine (NICODERM CQ - DOSED IN MG/24 HOURS) 21 mg/24hr patch Place 1 patch (21 mg total) onto the skin daily. 01/24/22   Omar Person, MD  olmesartan (BENICAR) 20 MG tablet Take 1 tablet (20 mg total) by mouth daily. 01/31/22   Runell Gess, MD  omeprazole (PRILOSEC) 20 MG capsule 1 cap(s) orally once a day for 90 days    [provider]  lisinopril-hydrochlorothiazide (PRINZIDE,ZESTORETIC) 20-12.5 MG tablet Take 1 tablet by mouth daily. Patient not  taking: Reported on 10/21/2018 04/26/15 09/11/20  Leta Baptist, MD  losartan (COZAAR) 100 MG tablet Take 100 mg by mouth daily.  05/30/20  [provider]      Allergies    Patient has no known allergies.    Review of Systems   Review of Systems  Physical Exam Updated Vital Signs BP (!) 188/111   Pulse 81   Temp 99 F (37.2 C)   Resp 18   Wt 81.6 kg   SpO2 98%   BMI 26.58 kg/m  Physical Exam Vitals and nursing note reviewed.  Constitutional:      Appearance: He is well-developed.  HENT:     Head: Normocephalic and atraumatic.     Comments: Swollen turbinates, posterior nasal drip, no noted sinus ttp, bilateral effusion to the TM without obvious erythema bulging or distortion of landmarks.  Eyes:     Pupils: Pupils are equal, round, and reactive to light.  Neck:     Vascular: No JVD.  Cardiovascular:     Rate and Rhythm: Normal rate and regular rhythm.     Heart sounds: No murmur heard.    No friction rub. No gallop.  Pulmonary:     Effort: No respiratory distress.     Breath sounds: Wheezing (end expiratory) present.  Abdominal:     General: There is no distension.     Tenderness: There is  no abdominal tenderness. There is no guarding or rebound.  Musculoskeletal:        General: Normal range of motion.     Cervical back: Normal range of motion and neck supple.  Skin:    Coloration: Skin is not pale.     Findings: No rash.  Neurological:     Mental Status: He is alert and oriented to person, place, and time.  Psychiatric:        Behavior: Behavior normal.     ED Results / Procedures / Treatments   Labs (all labs ordered are listed, but only abnormal results are displayed) Labs Reviewed - No data to display  EKG EKG Interpretation  Date/Time:  Monday July 28 2022 11:49:42 EDT Ventricular Rate:  89 PR Interval:  134 QRS Duration: 94 QT Interval:  370 QTC Calculation: 451 R Axis:   67 Text Interpretation: Sinus rhythm Consider left  ventricular hypertrophy No significant change since last tracing Confirmed by Melene Plan [phone]) on 07/28/2022 11:51:29 AM  Radiology No results found.  Procedures Procedures   Discussed smoking cessation with patient and was they were offerred resources to help stop.  Total time was 5 min CPT code 81191.     Medications Ordered in ED Medications  dexamethasone (DECADRON) tablet 10 mg (has no administration in time range)  amoxicillin (AMOXIL) capsule 1,000 mg (has no administration in time range)  albuterol (VENTOLIN HFA) 108 (90 Base) MCG/ACT inhaler 2 puff (has no administration in time range)  aerochamber plus with mask device 1 each (has no administration in time range)    ED Course/ Medical Decision Making/ A&P                             Medical Decision Making Risk Prescription drug management.   57 yo M with a chief complaints of cough congestion ear pain sore throat going on for a few days.  Clinically could be consistent with otitis media.  Will start on oral antibiotics.  Will have the patient follow-up with his family doctor in the office.  He is having some very faint wheezing on exam.  Active every day smoker.  Dose of Decadron.  Inhaler for home.  1:47 PM:  I have discussed the diagnosis/risks/treatment options with the patient.  Evaluation and diagnostic testing in the emergency department does not suggest an emergent condition requiring admission or immediate intervention beyond what has been performed at this time.  They will follow up with PCP. We also discussed returning to the ED immediately if new or worsening sx occur. We discussed the sx which are most concerning (e.g., sudden worsening pain, fever, inability to tolerate by mouth) that necessitate immediate return. Medications administered to the patient during their visit and any new prescriptions provided to the patient are listed below.  Medications given during this visit Medications  dexamethasone  (DECADRON) tablet 10 mg (has no administration in time range)  amoxicillin (AMOXIL) capsule 1,000 mg (has no administration in time range)  albuterol (VENTOLIN HFA) 108 (90 Base) MCG/ACT inhaler 2 puff (has no administration in time range)  aerochamber plus with mask device 1 each (has no administration in time range)     The patient appears reasonably screen and/or stabilized for discharge and I doubt any other medical condition or other [facility] requiring further screening, evaluation, or treatment in the ED at this time prior to discharge.  Final Clinical Impression(s) / ED Diagnoses Final diagnoses:  None    Rx / DC Orders ED Discharge Orders          Ordered    amoxicillin (AMOXIL) 500 MG capsule  2 times daily        07/28/22 1346              [place], DO 07/28/22 1347

## 2022-07-28 NOTE — Discharge Instructions (Signed)
Please go to the ER for further treatment of your high blood pressure

## 2022-07-28 NOTE — ED Triage Notes (Signed)
Pt states hasn't felt good since Friday, felling fatigue, sore throat, and head pressure. States his b/p has been high all weekend. States has been on b/p meds 3 months now and not working.

## 2022-07-28 NOTE — Discharge Instructions (Addendum)
Take tylenol 2 pills 4 times a day and motrin 4 pills 3 times a day.  Drink plenty of fluids.  Return for worsening shortness of breath, worsening headache, confusion. Follow up with your family doctor.  You can use the rescue inhaler up to 4 puffs every 4 hours while you are awake.  If you need to use it more often than not then you need to come back to the emergency department to be reevaluated.

## 2022-07-28 NOTE — ED Triage Notes (Signed)
Was seen at [facility] for sore throat and headache , Hypertension during his visit , sent here for further evaluation . Denies chest pain or dizziness. Alert and oriented x 4 , ambulatory to triage with steady gait

## 2022-07-31 ENCOUNTER — Encounter: Payer: Self-pay | Admitting: Sports Medicine

## 2022-08-08 ENCOUNTER — Ambulatory Visit
Admission: RE | Admit: 2022-08-08 | Discharge: 2022-08-08 | Disposition: A | Discharge status: Home / Self Care | Payer: Commercial Managed Care - PPO | Source: Ambulatory Visit | Attending: Sports Medicine | Admitting: Sports Medicine

## 2022-08-08 DIAGNOSIS — M5451 Vertebrogenic low back pain: Secondary | ICD-10-CM

## 2022-09-17 ENCOUNTER — Emergency Department (HOSPITAL_COMMUNITY)
Admission: EM | Admit: 2022-09-17 | Discharge: 2022-09-17 | Disposition: A | Discharge status: Home / Self Care | Payer: Self-pay | Attending: Emergency Medicine | Admitting: Emergency Medicine

## 2022-09-17 ENCOUNTER — Emergency Department (HOSPITAL_COMMUNITY): Payer: Self-pay

## 2022-09-17 ENCOUNTER — Ambulatory Visit
Admission: EM | Admit: 2022-09-17 | Discharge: 2022-09-17 | Disposition: A | Discharge status: Home / Self Care | Payer: Commercial Managed Care - PPO

## 2022-09-17 ENCOUNTER — Other Ambulatory Visit: Payer: Self-pay

## 2022-09-17 DIAGNOSIS — Z720 Tobacco use: Secondary | ICD-10-CM | POA: Insufficient documentation

## 2022-09-17 DIAGNOSIS — N410 Acute prostatitis: Secondary | ICD-10-CM | POA: Insufficient documentation

## 2022-09-17 DIAGNOSIS — R1031 Right lower quadrant pain: Secondary | ICD-10-CM

## 2022-09-17 DIAGNOSIS — N50819 Testicular pain, unspecified: Secondary | ICD-10-CM

## 2022-09-17 DIAGNOSIS — M7918 Myalgia, other site: Secondary | ICD-10-CM

## 2022-09-17 DIAGNOSIS — Z79899 Other long term (current) drug therapy: Secondary | ICD-10-CM | POA: Insufficient documentation

## 2022-09-17 DIAGNOSIS — I10 Essential (primary) hypertension: Secondary | ICD-10-CM | POA: Insufficient documentation

## 2022-09-17 DIAGNOSIS — N4889 Other specified disorders of penis: Secondary | ICD-10-CM

## 2022-09-17 DIAGNOSIS — N419 Inflammatory disease of prostate, unspecified: Secondary | ICD-10-CM

## 2022-09-17 LAB — CBC WITH DIFFERENTIAL/PLATELET
Abs Immature Granulocytes: 0.02 10*3/uL (ref 0.00–0.07)
Basophils Absolute: 0.1 10*3/uL (ref 0.0–0.1)
Basophils Relative: 1 %
Eosinophils Absolute: 0.2 10*3/uL (ref 0.0–0.5)
Eosinophils Relative: 2 %
HCT: 41.8 % (ref 39.0–52.0)
Hemoglobin: 14.3 g/dL (ref 13.0–17.0)
Immature Granulocytes: 0 %
Lymphocytes Relative: 32 %
Lymphs Abs: 2.7 10*3/uL (ref 0.7–4.0)
MCH: 30.8 pg (ref 26.0–34.0)
MCHC: 34.2 g/dL (ref 30.0–36.0)
MCV: 89.9 fL (ref 80.0–100.0)
Monocytes Absolute: 0.6 10*3/uL (ref 0.1–1.0)
Monocytes Relative: 7 %
Neutro Abs: 5 10*3/uL (ref 1.7–7.7)
Neutrophils Relative %: 58 %
Platelets: 112 10*3/uL — ABNORMAL LOW (ref 150–400)
RBC: 4.65 MIL/uL (ref 4.22–5.81)
RDW: 13.2 % (ref 11.5–15.5)
WBC: 8.6 10*3/uL (ref 4.0–10.5)
nRBC: 0 % (ref 0.0–0.2)

## 2022-09-17 LAB — COMPREHENSIVE METABOLIC PANEL
ALT: 18 U/L (ref 0–44)
AST: 19 U/L (ref 15–41)
Albumin: 3.7 g/dL (ref 3.5–5.0)
Alkaline Phosphatase: 74 U/L (ref 38–126)
Anion gap: 9 (ref 5–15)
BUN: 13 mg/dL (ref 6–20)
CO2: 23 mmol/L (ref 22–32)
Calcium: 8.9 mg/dL (ref 8.9–10.3)
Chloride: 108 mmol/L (ref 98–111)
Creatinine, Ser: 0.81 mg/dL (ref 0.61–1.24)
GFR, Estimated: >60 mL/min (ref >60)
Glucose, Bld: 85 mg/dL (ref 70–99)
Potassium: 3.7 mmol/L (ref 3.5–5.1)
Sodium: 140 mmol/L (ref 135–145)
Total Bilirubin: 0.9 mg/dL (ref 0.3–1.2)
Total Protein: 7.6 g/dL (ref 6.5–8.1)

## 2022-09-17 LAB — URINALYSIS, W/ REFLEX TO CULTURE (INFECTION SUSPECTED)
Bilirubin Urine: NEGATIVE
Glucose, UA: NEGATIVE mg/dL
Ketones, ur: NEGATIVE mg/dL
Leukocytes,Ua: NEGATIVE
Nitrite: NEGATIVE
Protein, ur: NEGATIVE mg/dL
Specific Gravity, Urine: 1.024 (ref 1.005–1.030)
pH: 5 (ref 5.0–8.0)

## 2022-09-17 MED ORDER — CIPROFLOXACIN HCL 500 MG PO TABS: 500.0000 mg | ORAL_TABLET | Freq: Two times a day (BID) | ORAL | 0 refills | Status: DC

## 2022-09-17 MED ORDER — ONDANSETRON HCL 4 MG/2ML IJ SOLN
4.0000 mg | Freq: Once | INTRAMUSCULAR | Status: AC
  Administered 2022-09-17: 4 mg via INTRAVENOUS
  Filled 2022-09-17: qty 2

## 2022-09-17 MED ORDER — MORPHINE SULFATE (PF) 4 MG/ML IV SOLN
4.0000 mg | Freq: Once | INTRAVENOUS | Status: AC
  Administered 2022-09-17: 4 mg via INTRAVENOUS
  Filled 2022-09-17: qty 1

## 2022-09-17 MED ORDER — OXYCODONE-ACETAMINOPHEN 5-325 MG PO TABS
1.0000 | ORAL_TABLET | Freq: Once | ORAL | Status: AC
  Administered 2022-09-17: 1 via ORAL
  Filled 2022-09-17: qty 1

## 2022-09-17 MED ORDER — SODIUM CHLORIDE (PF) 0.9 % IJ SOLN
INTRAMUSCULAR | Status: AC
  Filled 2022-09-17: qty 50

## 2022-09-17 MED ORDER — IOHEXOL 300 MG/ML  SOLN
100.0000 mL | Freq: Once | INTRAMUSCULAR | Status: AC | PRN
  Administered 2022-09-17: 100 mL via INTRAVENOUS

## 2022-09-17 NOTE — ED Notes (Signed)
Patient transported to CT 

## 2022-09-17 NOTE — Discharge Instructions (Signed)
Go straight to the emergency department as soon as you leave urgent care for further evaluation and management. 

## 2022-09-17 NOTE — ED Provider Notes (Signed)
[place] EMERGENCY DEPARTMENT AT [facility] Provider Note   CSN: [id number] Arrival date & time: 09/17/22  1137     History  Chief Complaint  Patient presents with   Abdominal Pain    Derrick Watson is a 57 y.o. male with a history of hypertension and hyperlipidemia who presents to the ED for abdominal pain. Patient reports that he first experience sharp, shooting pain in his penis about 3 weeks go. Since then he has developed pain in his rectum, right lower quadrant, and scrotum. This pain is worse with movement. Additionally, he complains of difficulty and pain with urination. This morning he was not able to urinate at all so he went to urgent care for evaluation. They instructed him to go to the ED for for workup for possible prostatitis. He has taken Tylenol with minor relief of symptoms. No other complaints or concerns at this time.    Home Medications Prior to Admission medications   Medication Sig Start Date End Date Taking? Authorizing Provider  ciprofloxacin (CIPRO) 500 MG tablet Take 1 tablet (500 mg total) by mouth every 12 (twelve) hours for 14 days. 09/17/22 10/01/22 Yes Maxwell Marion, PA-C  albuterol (PROAIR HFA) 108 (90 Base) MCG/ACT inhaler 2 puff(s) inhaled every 6 hours for 30 day(s)    [provider]  albuterol (VENTOLIN HFA) 108 (90 Base) MCG/ACT inhaler Inhale 2 puffs into the lungs every 6 (six) hours as needed for wheezing or shortness of breath. 01/24/22   Omar Person, MD  amoxicillin (AMOXIL) 500 MG capsule Take 2 capsules (1,000 mg total) by mouth 2 (two) times daily. 07/28/22   Melene Plan, DO  Fluticasone-Umeclidin-Vilant (TRELEGY ELLIPTA) 200-62.5-25 MCG/ACT AEPB Inhale 28 each into the lungs daily. 01/24/22   Omar Person, MD  nicotine (NICODERM CQ - DOSED IN MG/24 HOURS) 14 mg/24hr patch Place 1 patch (14 mg total) onto the skin daily. 02/21/22   Omar Person, MD  nicotine (NICODERM CQ - DOSED IN MG/24 HOURS) 21 mg/24hr patch  Place 1 patch (21 mg total) onto the skin daily. 01/24/22   Omar Person, MD  olmesartan (BENICAR) 20 MG tablet Take 1 tablet (20 mg total) by mouth daily. 01/31/22   Runell Gess, MD  omeprazole (PRILOSEC) 20 MG capsule 1 cap(s) orally once a day for 90 days    [provider]  lisinopril-hydrochlorothiazide (PRINZIDE,ZESTORETIC) 20-12.5 MG tablet Take 1 tablet by mouth daily. Patient not taking: Reported on 10/21/2018 04/26/15 09/11/20  Leta Baptist, MD  losartan (COZAAR) 100 MG tablet Take 100 mg by mouth daily.  05/30/20  [provider]      Allergies    Patient has no known allergies.    Review of Systems   Review of Systems  Gastrointestinal:  Positive for abdominal pain.  All other systems reviewed and are negative.   Physical Exam Updated Vital Signs BP (!) 154/101 (BP Location: Right Arm)   Pulse 89   Temp 98.4 F (36.9 C) (Oral)   Resp 16   SpO2 94%  Physical Exam Vitals and nursing note reviewed.  Constitutional:      Appearance: Normal appearance.  HENT:     Head: Normocephalic and atraumatic.     Mouth/Throat:     Mouth: Mucous membranes are moist.  Eyes:     Conjunctiva/sclera: Conjunctivae normal.     Pupils: Pupils are equal, round, and reactive to light.  Cardiovascular:     Rate and Rhythm: Normal rate  and regular rhythm.     Pulses: Normal pulses.     Heart sounds: Normal heart sounds.  Pulmonary:     Effort: Pulmonary effort is normal.     Breath sounds: Normal breath sounds.  Abdominal:     Palpations: Abdomen is soft.     Tenderness: There is abdominal tenderness. There is right CVA tenderness.     Comments: RLQ tenderness  Genitourinary:    Penis: Normal.      Testes: Normal.  Skin:    General: Skin is warm and dry.     Findings: No rash.  Neurological:     General: No focal deficit present.     Mental Status: He is alert.  Psychiatric:        Mood and Affect: Mood normal.        Behavior: Behavior normal.      ED Results / Procedures / Treatments   Labs (all labs ordered are listed, but only abnormal results are displayed) Labs Reviewed  CBC WITH DIFFERENTIAL/PLATELET - Abnormal; Notable for the following components:      Result Value   Platelets 112 (*)    All other components within normal limits  URINALYSIS, W/ REFLEX TO CULTURE (INFECTION SUSPECTED) - Abnormal; Notable for the following components:   Hgb urine dipstick SMALL (*)    Bacteria, UA RARE (*)    All other components within normal limits  COMPREHENSIVE METABOLIC PANEL    EKG None  Radiology CT ABDOMEN PELVIS W CONTRAST  Result Date: 09/17/2022 CLINICAL DATA:  Right lower quadrant abdominal pain EXAM: CT ABDOMEN AND PELVIS WITH CONTRAST TECHNIQUE: Multidetector CT imaging of the abdomen and pelvis was performed using the standard protocol following bolus administration of intravenous contrast. RADIATION DOSE REDUCTION: This exam was performed according to the departmental dose-optimization program which includes automated exposure control, adjustment of the mA and/or kV according to patient size and/or use of iterative reconstruction technique. CONTRAST:  OMNIPAQUE IOHEXOL 300 MG/ML  SOLN COMPARISON:  Ultrasound 01/13/2022.  CT 11/25/2021 FINDINGS: Lower chest: Breathing motion. No consolidation, pneumothorax or effusion. Metallic focus seen along the anterior margin of the pericardium on image 7 of series 2, unchanged. Please correlate for radiopaque foreign body. Hepatobiliary: Fatty liver infiltration. Patent portal vein. Gallbladder is nondilated. Pancreas: Unremarkable. No pancreatic ductal dilatation or surrounding inflammatory changes. Spleen: Normal in size without focal abnormality. Adrenals/Urinary Tract: Adrenal glands are preserved. No collecting system dilatation. The ureters have normal course and caliber extending down to the bladder. Preserved contours of the urinary bladder. Both kidneys have several tiny  low-attenuation lesions which are too small to completely characterize but likely benign cysts. Bosniak 2 lesions. However there is 1 lesion exophytic from the upper pole of the left kidney which is more complex but still very small measuring 9 mm on series 2, image 16. Hounsfield units of 85 on portal venous phase and 66 on delayed. This could be an enhancing lesion but again is difficult to evaluate with the size. Would recommend further workup when appropriate such as dynamic MRI. Alternatively a short follow-up due to its size in 3-6 months may be of some benefit. Stomach/Bowel: No oral contrast. Redundant course of the sigmoid colon. Otherwise the colon has a overall normal course and caliber with scattered colonic diverticula and stool. Normal appendix extends medial to the cecum in the right hemipelvis. Stomach is relatively collapsed. As seen previously there appears to be some gastric fold thickening. Small bowel is nondilated. The prior  study had a high density area along a loop of proximal jejunum this has not clearly seen on today's examination. Please correlate with prior workup. Vascular/Lymphatic: Aortic atherosclerosis. No enlarged abdominal or pelvic lymph nodes. Reproductive: Prostate is unremarkable. Other: No abdominal wall hernia or abnormality. No abdominopelvic ascites. Musculoskeletal: Scattered degenerative changes of the spine and pelvis. Mild disc bulging at multiple levels. IMPRESSION: Scattered colonic diverticula. No obstruction, free air or free fluid. Normal appendix. Previous high density lesion along the proximal jejunum is not clearly seen on the current examination. Please correlate with prior workup. Fatty liver infiltration. Slightly complex appearing small upper pole left-sided renal lesion. This could be an enhancing focus or neoplasm but again is quite small. Recommend further additional workup. The patient may have radiopaque foreign body near the heart. If the patient is  unable to undergo MRI evaluation, a short follow-up CT scan in 3-6 months may be useful to assess for interval growth. Electronically Signed   By: Karen Kays M.D.   On: 09/17/2022 15:17    Procedures Procedures: not indicated.   Medications Ordered in ED Medications  morphine (PF) 4 MG/ML injection 4 mg (4 mg Intravenous Given 09/17/22 1231)  ondansetron (ZOFRAN) injection 4 mg (4 mg Intravenous Given 09/17/22 1231)  iohexol (OMNIPAQUE) 300 MG/ML solution 100 mL (100 mLs Intravenous Contrast Given 09/17/22 1432)  oxyCODONE-acetaminophen (PERCOCET/ROXICET) 5-325 MG per tablet 1 tablet (1 tablet Oral Given 09/17/22 1500)    ED Course/ Medical Decision Making/ A&P                             Medical Decision Making  This patient presents to the ED for concern of penile, abdominal and rectum pain, this involves an extensive number of treatment options, and is a complaint that carries with it a high risk of complications and morbidity.   Differential diagnosis includes: bowel perforation, colitis, appendicitis, pyelonephritis, cystitis, epididymitis, prostatitis.   Co morbidities that complicate the patient evaluation  Hypertension Hyperlipidemia   Additional history obtained:  Additional history obtained from urgent care note.   Lab Tests:  I ordered and personally interpreted labs.  The pertinent results include:   CMP and CBC within normal limits. U/A   Imaging Studies ordered:  I ordered imaging studies including CT abdomen/pelvis with contrast  I independently visualized and interpreted imaging which showed: Scattered colonic diverticula. No obstruction, free air or free fluid. Normal appendix.  Previous high density lesion along the proximal jejunum is not clearly seen on the current examination. Please correlate with prior workup.  Fatty liver infiltration. Slightly complex appearing small upper pole left-sided renal lesion. This could be an enhancing focus or neoplasm  but again is quite small. Recommend further additional workup. The patient may have radiopaque foreign body near the heart. If the patient is unable to undergo MRI evaluation, a short follow-up CT scan in 3-6 months may be useful to assess for interval growth. I agree with the radiologist interpretation   Problem List / ED Course / Critical interventions / Medication management  RLQ pain, right flank pain, dysuria, penile pain, scrotal pain, rectal pain I ordered medications including: Morphine followed by Percocet for pain Zofran for nausea Reevaluation of the patient after these medicines showed that the patient improved I have reviewed the patients home medicines and have made adjustments as needed   Social Determinants of Health:  Tobacco use Occupation Loss adjuster, chartered / Admission - Considered:  Patient is hemodynamically stable and safe for discharge home. Ciprofloxacin given for suspected prostatitis. Return precautions given.        Final Clinical Impression(s) / ED Diagnoses Final diagnoses:  Prostatitis, unspecified prostatitis type    Rx / DC Orders ED Discharge Orders          Ordered    ciprofloxacin (CIPRO) 500 MG tablet  Every 12 hours        09/17/22 1533              Maxwell Marion, PA-C 09/17/22 1545    Bethann Berks[place], MD 09/18/22 1119

## 2022-09-17 NOTE — ED Triage Notes (Signed)
Pt with lower abdominal pain, penile pain, and scrotal pain.  Pt has been seen at urgent care and told to come to the ED for eval of prostatitis.  Trouble maintaining urine stream.  Symptoms have been going on for 2 weeks but increasingly worsening.

## 2022-09-17 NOTE — ED Triage Notes (Signed)
Pt present lower abdominal,penis, testicle, buttocks pain. Symptoms started two weeks ago. Pt states he has the stop and go peeing or cannot go at all.

## 2022-09-17 NOTE — Discharge Instructions (Addendum)
Take Cipro 500 MG twice a day for 2 weeks for suspected prostatitis. It will take about 48 hours to see effect of antibiotic. Please complete full course of medication. Alternate between Ibuprofen and Tylenol every 4 hours as needed for pain.  Please follow up with PCP for further evaluation of renal cyst.  Return to ED if: You have chills. You feel light-headed. You feel like you may faint. You cannot pee. You have blood or clumps of blood (blood clots) in your pee.
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
## 2022-10-01 ENCOUNTER — Ambulatory Visit
Admission: EM | Admit: 2022-10-01 | Discharge: 2022-10-01 | Disposition: A | Discharge status: Home / Self Care | Payer: Medicaid Other

## 2022-10-01 DIAGNOSIS — J309 Allergic rhinitis, unspecified: Secondary | ICD-10-CM

## 2022-10-01 DIAGNOSIS — J029 Acute pharyngitis, unspecified: Secondary | ICD-10-CM

## 2022-10-01 DIAGNOSIS — H6992 Unspecified Eustachian tube disorder, left ear: Secondary | ICD-10-CM

## 2022-10-01 DIAGNOSIS — R0982 Postnasal drip: Secondary | ICD-10-CM

## 2022-10-01 LAB — POCT RAPID STREP A (OFFICE): Rapid Strep A Screen: NEGATIVE

## 2022-10-01 MED ORDER — FLUTICASONE PROPIONATE 50 MCG/ACT NA SUSP
1.0000 | Freq: Every day | NASAL | 0 refills | Status: DC
Start: 2022-10-01 — End: 2023-04-29

## 2022-10-01 MED ORDER — CETIRIZINE HCL 10 MG PO TABS
10.0000 mg | ORAL_TABLET | Freq: Every day | ORAL | 0 refills | Status: DC
Start: 2022-10-01 — End: 2023-04-29
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
## 2022-10-01 NOTE — ED Triage Notes (Signed)
Pt presents to UC w/ c/o sore throat left sided tenderness, ear ache x1 week. Pt has tried tylenol and ibuprofen with some relief.

## 2022-10-01 NOTE — Discharge Instructions (Signed)
Your strep test was negative.  Your exam and symptoms are consistent with allergies Start cetirizine daily for 2 weeks Flonase daily Rest and fluids Follow-up with your PCP if your symptoms do not improve Please go to the emergency room for any worsening symptoms Hope you feel better soon!

## 2022-10-02 LAB — CULTURE, GROUP A STREP (THRC)

## 2022-10-03 LAB — CULTURE, GROUP A STREP (THRC)

## 2022-10-04 LAB — CULTURE, GROUP A STREP (THRC)

## 2022-10-13 ENCOUNTER — Encounter (HOSPITAL_COMMUNITY): Payer: Self-pay

## 2022-10-13 ENCOUNTER — Ambulatory Visit
Admission: EM | Admit: 2022-10-13 | Discharge: 2022-10-13 | Disposition: A | Discharge status: Home / Self Care | Payer: Medicaid Other | Attending: Urgent Care | Admitting: Urgent Care

## 2022-10-13 ENCOUNTER — Emergency Department (HOSPITAL_COMMUNITY)
Admission: EM | Admit: 2022-10-13 | Discharge: 2022-10-13 | Disposition: A | Discharge status: Home / Self Care | Payer: Medicaid Other | Attending: Emergency Medicine | Admitting: Emergency Medicine

## 2022-10-13 ENCOUNTER — Emergency Department (HOSPITAL_COMMUNITY): Payer: Medicaid Other

## 2022-10-13 ENCOUNTER — Other Ambulatory Visit: Payer: Self-pay

## 2022-10-13 DIAGNOSIS — R519 Headache, unspecified: Secondary | ICD-10-CM | POA: Insufficient documentation

## 2022-10-13 DIAGNOSIS — I160 Hypertensive urgency: Secondary | ICD-10-CM

## 2022-10-13 DIAGNOSIS — R42 Dizziness and giddiness: Secondary | ICD-10-CM

## 2022-10-13 DIAGNOSIS — F17210 Nicotine dependence, cigarettes, uncomplicated: Secondary | ICD-10-CM | POA: Insufficient documentation

## 2022-10-13 DIAGNOSIS — R112 Nausea with vomiting, unspecified: Secondary | ICD-10-CM

## 2022-10-13 DIAGNOSIS — I10 Essential (primary) hypertension: Secondary | ICD-10-CM | POA: Insufficient documentation

## 2022-10-13 DIAGNOSIS — Z79899 Other long term (current) drug therapy: Secondary | ICD-10-CM | POA: Insufficient documentation

## 2022-10-13 LAB — COMPREHENSIVE METABOLIC PANEL
ALT: 20 U/L (ref 0–44)
AST: 24 U/L (ref 15–41)
Albumin: 3.9 g/dL (ref 3.5–5.0)
Alkaline Phosphatase: 79 U/L (ref 38–126)
Anion gap: 12 (ref 5–15)
BUN: 9 mg/dL (ref 6–20)
CO2: 23 mmol/L (ref 22–32)
Calcium: 8.9 mg/dL (ref 8.9–10.3)
Chloride: 103 mmol/L (ref 98–111)
Creatinine, Ser: 0.74 mg/dL (ref 0.61–1.24)
GFR, Estimated: >60 mL/min (ref >60)
Glucose, Bld: 98 mg/dL (ref 70–99)
Potassium: 3.5 mmol/L (ref 3.5–5.1)
Sodium: 138 mmol/L (ref 135–145)
Total Bilirubin: 1.1 mg/dL (ref 0.3–1.2)
Total Protein: 8.2 g/dL — ABNORMAL HIGH (ref 6.5–8.1)

## 2022-10-13 LAB — DIFFERENTIAL
Abs Immature Granulocytes: 0.03 10*3/uL (ref 0.00–0.07)
Basophils Absolute: 0.1 10*3/uL (ref 0.0–0.1)
Basophils Relative: 1 %
Eosinophils Absolute: 0 10*3/uL (ref 0.0–0.5)
Eosinophils Relative: 0 %
Immature Granulocytes: 0 %
Lymphocytes Relative: 20 %
Lymphs Abs: 2.2 10*3/uL (ref 0.7–4.0)
Monocytes Absolute: 0.6 10*3/uL (ref 0.1–1.0)
Monocytes Relative: 6 %
Neutro Abs: 8.1 10*3/uL — ABNORMAL HIGH (ref 1.7–7.7)
Neutrophils Relative %: 73 %

## 2022-10-13 LAB — I-STAT CHEM 8, ED
BUN: 9 mg/dL (ref 6–20)
Calcium, Ion: 1.13 mmol/L — ABNORMAL LOW (ref 1.15–1.40)
Chloride: 105 mmol/L (ref 98–111)
Creatinine, Ser: 0.8 mg/dL (ref 0.61–1.24)
Glucose, Bld: 98 mg/dL (ref 70–99)
HCT: 48 % (ref 39.0–52.0)
Hemoglobin: 16.3 g/dL (ref 13.0–17.0)
Potassium: 3.6 mmol/L (ref 3.5–5.1)
Sodium: 142 mmol/L (ref 135–145)
TCO2: 27 mmol/L (ref 22–32)

## 2022-10-13 LAB — ETHANOL: Alcohol, Ethyl (B): <10 mg/dL (ref <10)

## 2022-10-13 LAB — CBC
HCT: 45.5 % (ref 39.0–52.0)
Hemoglobin: 15.8 g/dL (ref 13.0–17.0)
MCH: 31.4 pg (ref 26.0–34.0)
MCHC: 34.7 g/dL (ref 30.0–36.0)
MCV: 90.5 fL (ref 80.0–100.0)
Platelets: UNDETERMINED 10*3/uL (ref 150–400)
RBC: 5.03 MIL/uL (ref 4.22–5.81)
RDW: 13.2 % (ref 11.5–15.5)
WBC: 11.1 10*3/uL — ABNORMAL HIGH (ref 4.0–10.5)
nRBC: 0 % (ref 0.0–0.2)

## 2022-10-13 LAB — GROUP A STREP BY PCR: Group A Strep by PCR: NOT DETECTED

## 2022-10-13 LAB — PROTIME-INR
INR: 1 (ref 0.8–1.2)
Prothrombin Time: 13.4 seconds (ref 11.4–15.2)

## 2022-10-13 LAB — CBG MONITORING, ED: Glucose-Capillary: 105 mg/dL — ABNORMAL HIGH (ref 70–99)

## 2022-10-13 LAB — APTT: aPTT: 25 seconds (ref 24–36)

## 2022-10-13 MED ORDER — PROCHLORPERAZINE EDISYLATE 10 MG/2ML IJ SOLN
10.0000 mg | Freq: Once | INTRAMUSCULAR | Status: AC
  Administered 2022-10-13: 10 mg via INTRAVENOUS
  Filled 2022-10-13: qty 2

## 2022-10-13 MED ORDER — SODIUM CHLORIDE 0.9% FLUSH: 3.0000 mL | Freq: Once | INTRAVENOUS | Status: DC

## 2022-10-13 MED ORDER — IOHEXOL 350 MG/ML SOLN
75.0000 mL | Freq: Once | INTRAVENOUS | Status: AC | PRN
  Administered 2022-10-13: 75 mL via INTRAVENOUS

## 2022-10-13 MED ORDER — ACETAMINOPHEN 500 MG PO TABS
1000.0000 mg | ORAL_TABLET | Freq: Once | ORAL | Status: AC
  Administered 2022-10-13: 1000 mg via ORAL
  Filled 2022-10-13: qty 2

## 2022-10-13 MED ORDER — HYDRALAZINE HCL 25 MG PO TABS
50.0000 mg | ORAL_TABLET | Freq: Once | ORAL | Status: AC
  Administered 2022-10-13: 50 mg via ORAL
  Filled 2022-10-13: qty 2

## 2022-10-13 MED ORDER — DIPHENHYDRAMINE HCL 50 MG/ML IJ SOLN
12.5000 mg | Freq: Once | INTRAMUSCULAR | Status: AC
  Administered 2022-10-13: 12.5 mg via INTRAVENOUS
  Filled 2022-10-13: qty 1
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
## 2022-10-13 NOTE — ED Triage Notes (Signed)
Per pt and fiance pt with dizziness, n/v started 5am-pt with slow gait to tx room-denies pain

## 2022-10-13 NOTE — Discharge Instructions (Addendum)
We evaluated you in the emergency department for your headache.  Your testing including a CT scan of your brain and blood work did not show any dangerous process.  Your symptoms improved in the emergency department with treatment of the headache.  Since you are also having a sore throat, your headache could be due to a viral infection.  Your strep test was negative.  Please take at 1000 mg of Tylenol every 6 hours as needed for headache.  Please also get lots of rest and drink a lot of fluids.  Please follow-up with your primary doctor.  If you have any new or worsening symptoms, please return to the emergency department for repeat evaluation.

## 2022-10-13 NOTE — ED Notes (Signed)
Pt symptoms started Saturday, code stroke not being activated
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
## 2022-10-13 NOTE — ED Notes (Signed)
Patient is being discharged from the Urgent Care and sent to the Emergency Department via POV . Per Urban Gibson, PA-C, patient is in need of higher level of care due to HTN, dizziness. Patient is aware and verbalizes understanding of plan of care.  Vitals:   10/13/22 1737  BP: (!) 184/118  Pulse: 88  Resp: 20  Temp: 98 F (36.7 C)  SpO2: 98%

## 2022-10-13 NOTE — ED Triage Notes (Signed)
Pt arrived from home via POV c/o severe headache 10/10. Hypertensive 199/125 in triage. Pt states that he has had trouble keeping his balance since sat. NIH of 1 in triage. Pt also noted to have ver sore and swollen lymphnodes on left side of throat.

## 2022-10-13 NOTE — ED Notes (Signed)
Pt ambulated in the hallway with no assistance

## 2022-11-11 ENCOUNTER — Telehealth: Payer: Self-pay | Admitting: Student

## 2022-11-11 NOTE — Telephone Encounter (Signed)
PT's Significant other stopped by the front desk to see if we had any Trelegy samples. Everyone was at lunch. Please call to advise. Her # [phone]

## 2022-11-13 NOTE — Telephone Encounter (Signed)
Please call patient for the following:  OK to provide 1 month Trelegy 200 sample if supplies available. Patient will need follow-up appointment to be scheduled with any APP or physician when next available since Dr. Thora Lance is no longer with the office.  If patient has PCP, they can also order inhalers until he can be established with a new pulmonologist.

## 2022-11-18 MED ORDER — TRELEGY ELLIPTA 200-62.5-25 MCG/ACT IN AEPB: 1.0000 | INHALATION_SPRAY | Freq: Every day | RESPIRATORY_TRACT | 0 refills | Status: DC

## 2022-11-18 NOTE — Telephone Encounter (Signed)
Called and spoke with patient's SO Marcelino Duster. She is aware that I have placed 2 samples at the front desk for him. I attempted to get him scheduled for a new doctor but per Marcelino Duster, she is waiting for his new insurance to kick in on September 15th. Once the insurance has kicked in, she will call back to make his appt.   Nothing further needed at time of call.

## 2022-12-16 ENCOUNTER — Emergency Department (HOSPITAL_COMMUNITY)
Admission: EM | Admit: 2022-12-16 | Discharge: 2022-12-16 | Disposition: A | Discharge status: Home / Self Care | Payer: Self-pay | Attending: Emergency Medicine | Admitting: Emergency Medicine

## 2022-12-16 ENCOUNTER — Other Ambulatory Visit: Payer: Self-pay

## 2022-12-16 ENCOUNTER — Emergency Department (HOSPITAL_COMMUNITY): Payer: Self-pay

## 2022-12-16 ENCOUNTER — Encounter (HOSPITAL_COMMUNITY): Payer: Self-pay

## 2022-12-16 DIAGNOSIS — S12401A Unspecified nondisplaced fracture of fifth cervical vertebra, initial encounter for closed fracture: Secondary | ICD-10-CM | POA: Insufficient documentation

## 2022-12-16 DIAGNOSIS — W1830XA Fall on same level, unspecified, initial encounter: Secondary | ICD-10-CM | POA: Insufficient documentation

## 2022-12-16 MED ORDER — IBUPROFEN 600 MG PO TABS: 600.0000 mg | ORAL_TABLET | Freq: Four times a day (QID) | ORAL | 1 refills | Status: DC | PRN

## 2022-12-16 MED ORDER — IRBESARTAN 150 MG PO TABS
150.0000 mg | ORAL_TABLET | Freq: Every day | ORAL | Status: DC
  Administered 2022-12-16: 150 mg via ORAL
  Filled 2022-12-16: qty 1

## 2022-12-16 MED ORDER — CYCLOBENZAPRINE HCL 10 MG PO TABS
10.0000 mg | ORAL_TABLET | Freq: Once | ORAL | Status: AC
  Administered 2022-12-16: 10 mg via ORAL
  Filled 2022-12-16: qty 1

## 2022-12-16 MED ORDER — LIDOCAINE 5 % EX PTCH
1.0000 | MEDICATED_PATCH | CUTANEOUS | Status: DC
  Administered 2022-12-16: 1 via TRANSDERMAL
  Filled 2022-12-16: qty 1

## 2022-12-16 MED ORDER — ACETAMINOPHEN 325 MG PO TABS
650.0000 mg | ORAL_TABLET | Freq: Once | ORAL | Status: AC
  Administered 2022-12-16: 650 mg via ORAL
  Filled 2022-12-16: qty 2

## 2022-12-16 MED ORDER — HYDROCODONE-ACETAMINOPHEN 5-325 MG PO TABS: 1.0000 | ORAL_TABLET | Freq: Four times a day (QID) | ORAL | 0 refills | Status: DC | PRN

## 2022-12-16 MED ORDER — KETOROLAC TROMETHAMINE 15 MG/ML IJ SOLN
15.0000 mg | Freq: Once | INTRAMUSCULAR | Status: AC
  Administered 2022-12-16: 15 mg via INTRAMUSCULAR
  Filled 2022-12-16: qty 1

## 2022-12-16 NOTE — ED Triage Notes (Signed)
Pt arrived POV reporting neck pain/back pain from fall last week. States fell down stairs at home. No blood thinners. Denies LOC
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
## 2022-12-16 NOTE — Discharge Instructions (Addendum)
It was a pleasure taking part in your care today.  As we discussed, you have a C5 fracture of your cervical spine.  Please remain in your cervical collar until you are seen by neurosurgery.  Please follow-up with the neurosurgery team this week.  Please call them and make an appointment to be seen utilizing the number on this form.  Please take prescribed pain medication as directed.  Please do not drive or operate heavy machinery while taking pain medication.  You may also take ibuprofen or Tylenol in the interim for pain.  Please read attached guide concerning cervical spine fracture.  Please return to the ED with any new or worsening symptoms such as weakness to your upper extremities, fevers.

## 2022-12-16 NOTE — Progress Notes (Signed)
Orthopedic Tech Progress Note Patient Details:  Derrick Watson [birth year] [id number]  Ortho Devices Type of Ortho Device: Aspen cervical collar Ortho Device/Splint Interventions: Ordered, Application, Adjustment   Post Interventions Patient Tolerated: Well Instructions Provided: Care of device, Adjustment of device  Kizzie Fantasia 12/16/2022, 1:59 PM

## 2022-12-25 ENCOUNTER — Other Ambulatory Visit: Payer: Self-pay

## 2022-12-25 ENCOUNTER — Emergency Department (HOSPITAL_COMMUNITY): Payer: Medicaid Other

## 2022-12-25 ENCOUNTER — Encounter (HOSPITAL_COMMUNITY): Payer: Self-pay | Admitting: Emergency Medicine

## 2022-12-25 ENCOUNTER — Emergency Department (HOSPITAL_COMMUNITY)
Admission: EM | Admit: 2022-12-25 | Discharge: 2022-12-25 | Disposition: A | Discharge status: Home / Self Care | Payer: Medicaid Other | Attending: Emergency Medicine | Admitting: Emergency Medicine

## 2022-12-25 DIAGNOSIS — S12491A Other nondisplaced fracture of fifth cervical vertebra, initial encounter for closed fracture: Secondary | ICD-10-CM | POA: Insufficient documentation

## 2022-12-25 DIAGNOSIS — S01511A Laceration without foreign body of lip, initial encounter: Secondary | ICD-10-CM | POA: Insufficient documentation

## 2022-12-25 DIAGNOSIS — S0990XA Unspecified injury of head, initial encounter: Secondary | ICD-10-CM | POA: Insufficient documentation

## 2022-12-25 MED ORDER — OXYCODONE-ACETAMINOPHEN 5-325 MG PO TABS: 1.0000 | ORAL_TABLET | ORAL | 0 refills | Status: DC | PRN

## 2022-12-25 MED ORDER — LIDOCAINE HCL (PF) 1 % IJ SOLN
30.0000 mL | Freq: Once | INTRAMUSCULAR | Status: AC
  Administered 2022-12-25: 30 mL via SUBCUTANEOUS
  Filled 2022-12-25: qty 30

## 2022-12-25 MED ORDER — CLINDAMYCIN HCL 150 MG PO CAPS: 300.0000 mg | ORAL_CAPSULE | Freq: Three times a day (TID) | ORAL | 0 refills | Status: DC

## 2022-12-25 MED ORDER — CHLORHEXIDINE GLUCONATE 0.12 % MT SOLN: 15.0000 mL | Freq: Two times a day (BID) | OROMUCOSAL | 0 refills | Status: DC

## 2022-12-25 MED ORDER — OXYCODONE-ACETAMINOPHEN 5-325 MG PO TABS
2.0000 | ORAL_TABLET | Freq: Once | ORAL | Status: AC
  Administered 2022-12-25: 2 via ORAL
  Filled 2022-12-25: qty 2
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
## 2023-01-06 ENCOUNTER — Encounter (HOSPITAL_COMMUNITY): Payer: Self-pay

## 2023-01-28 ENCOUNTER — Ambulatory Visit (HOSPITAL_COMMUNITY): Admission: RE | Admit: 2023-01-28 | Payer: Medicaid Other | Source: Ambulatory Visit

## 2023-02-05 ENCOUNTER — Other Ambulatory Visit: Payer: Self-pay

## 2023-02-05 MED ORDER — OLMESARTAN MEDOXOMIL 20 MG PO TABS: 20.0000 mg | ORAL_TABLET | Freq: Every day | ORAL | 0 refills | Status: DC

## 2023-04-20 ENCOUNTER — Ambulatory Visit (INDEPENDENT_AMBULATORY_CARE_PROVIDER_SITE_OTHER): Payer: Self-pay

## 2023-04-20 ENCOUNTER — Ambulatory Visit
Admission: EM | Admit: 2023-04-20 | Discharge: 2023-04-20 | Disposition: A | Discharge status: Home / Self Care | Payer: Self-pay | Attending: Family Medicine | Admitting: Family Medicine

## 2023-04-20 DIAGNOSIS — J069 Acute upper respiratory infection, unspecified: Secondary | ICD-10-CM

## 2023-04-20 DIAGNOSIS — J449 Chronic obstructive pulmonary disease, unspecified: Secondary | ICD-10-CM

## 2023-04-20 DIAGNOSIS — I10 Essential (primary) hypertension: Secondary | ICD-10-CM

## 2023-04-20 DIAGNOSIS — R051 Acute cough: Secondary | ICD-10-CM

## 2023-04-20 MED ORDER — PREDNISONE 20 MG PO TABS: 40.0000 mg | ORAL_TABLET | Freq: Every day | ORAL | 0 refills | Status: AC

## 2023-04-20 MED ORDER — OLMESARTAN MEDOXOMIL 40 MG PO TABS: 40.0000 mg | ORAL_TABLET | Freq: Every day | ORAL | 0 refills | Status: DC

## 2023-04-20 MED ORDER — ALBUTEROL SULFATE HFA 108 (90 BASE) MCG/ACT IN AERS: 1.0000 | INHALATION_SPRAY | Freq: Four times a day (QID) | RESPIRATORY_TRACT | 0 refills | Status: DC | PRN

## 2023-04-20 MED ORDER — BENZONATATE 200 MG PO CAPS: 200.0000 mg | ORAL_CAPSULE | Freq: Three times a day (TID) | ORAL | 0 refills | Status: DC | PRN

## 2023-04-20 NOTE — Discharge Instructions (Addendum)
 The clinic will contact you with results of the COVID test done today if positive.  Start Tessalon  as needed for cough.  I have refilled her olmesartan  increased to 40 mg a day.  Please take this daily and keep a blood pressure log taking your blood pressure once to twice a day.  Take this log to your PCP for further evaluation and treatment of your high blood pressure.  Please avoid over-the-counter cough medicines and decongestants as this raises your blood pressure.  Please go to the ER if you develop any worsening symptoms.  This includes but is not limited to chest pain, headache, visual changes, dizziness, shortness of breath, or any new concerns that arise.  I hope you feel better soon!

## 2023-04-20 NOTE — ED Triage Notes (Signed)
 Pt presents to UC for c/o fatigue, productive cough, rib pain from coughing x4 days. Tried dayquil and nyquil w/o relief
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
## 2023-04-21 LAB — SARS CORONAVIRUS 2 (TAT 6-24 HRS): SARS Coronavirus 2: NEGATIVE

## 2023-04-29 ENCOUNTER — Encounter: Payer: Self-pay | Admitting: Emergency Medicine

## 2023-04-29 ENCOUNTER — Inpatient Hospital Stay (HOSPITAL_COMMUNITY)
Admission: EM | Admit: 2023-04-29 | Discharge: 2023-05-01 | DRG: 305 | Disposition: A | Discharge status: Home / Self Care | Payer: Medicaid Other | Attending: Internal Medicine | Admitting: Internal Medicine

## 2023-04-29 ENCOUNTER — Ambulatory Visit
Admission: EM | Admit: 2023-04-29 | Discharge: 2023-04-29 | Disposition: A | Discharge status: Home / Self Care | Payer: Self-pay

## 2023-04-29 ENCOUNTER — Other Ambulatory Visit: Payer: Self-pay

## 2023-04-29 ENCOUNTER — Emergency Department (HOSPITAL_COMMUNITY): Payer: Medicaid Other

## 2023-04-29 DIAGNOSIS — I2510 Atherosclerotic heart disease of native coronary artery without angina pectoris: Secondary | ICD-10-CM | POA: Diagnosis present

## 2023-04-29 DIAGNOSIS — I700 Atherosclerosis of aorta: Secondary | ICD-10-CM | POA: Diagnosis present

## 2023-04-29 DIAGNOSIS — I10 Essential (primary) hypertension: Secondary | ICD-10-CM | POA: Diagnosis present

## 2023-04-29 DIAGNOSIS — R0602 Shortness of breath: Secondary | ICD-10-CM

## 2023-04-29 DIAGNOSIS — E785 Hyperlipidemia, unspecified: Secondary | ICD-10-CM | POA: Diagnosis present

## 2023-04-29 DIAGNOSIS — F1210 Cannabis abuse, uncomplicated: Secondary | ICD-10-CM | POA: Diagnosis present

## 2023-04-29 DIAGNOSIS — Z1152 Encounter for screening for COVID-19: Secondary | ICD-10-CM

## 2023-04-29 DIAGNOSIS — Z720 Tobacco use: Secondary | ICD-10-CM | POA: Diagnosis present

## 2023-04-29 DIAGNOSIS — R519 Headache, unspecified: Secondary | ICD-10-CM | POA: Diagnosis not present

## 2023-04-29 DIAGNOSIS — Z7982 Long term (current) use of aspirin: Secondary | ICD-10-CM

## 2023-04-29 DIAGNOSIS — E876 Hypokalemia: Secondary | ICD-10-CM | POA: Diagnosis present

## 2023-04-29 DIAGNOSIS — M5481 Occipital neuralgia: Secondary | ICD-10-CM | POA: Diagnosis present

## 2023-04-29 DIAGNOSIS — F1410 Cocaine abuse, uncomplicated: Secondary | ICD-10-CM | POA: Diagnosis present

## 2023-04-29 DIAGNOSIS — R079 Chest pain, unspecified: Secondary | ICD-10-CM

## 2023-04-29 DIAGNOSIS — I161 Hypertensive emergency: Secondary | ICD-10-CM

## 2023-04-29 DIAGNOSIS — I160 Hypertensive urgency: Principal | ICD-10-CM | POA: Diagnosis present

## 2023-04-29 DIAGNOSIS — R202 Paresthesia of skin: Secondary | ICD-10-CM | POA: Diagnosis present

## 2023-04-29 DIAGNOSIS — F1010 Alcohol abuse, uncomplicated: Secondary | ICD-10-CM | POA: Diagnosis present

## 2023-04-29 DIAGNOSIS — E782 Mixed hyperlipidemia: Secondary | ICD-10-CM

## 2023-04-29 DIAGNOSIS — F17290 Nicotine dependence, other tobacco product, uncomplicated: Secondary | ICD-10-CM | POA: Diagnosis present

## 2023-04-29 DIAGNOSIS — I252 Old myocardial infarction: Secondary | ICD-10-CM

## 2023-04-29 DIAGNOSIS — R200 Anesthesia of skin: Secondary | ICD-10-CM | POA: Diagnosis present

## 2023-04-29 DIAGNOSIS — R072 Precordial pain: Secondary | ICD-10-CM

## 2023-04-29 DIAGNOSIS — F17210 Nicotine dependence, cigarettes, uncomplicated: Secondary | ICD-10-CM | POA: Diagnosis present

## 2023-04-29 DIAGNOSIS — Z79899 Other long term (current) drug therapy: Secondary | ICD-10-CM

## 2023-04-29 HISTORY — DX: Atherosclerotic heart disease of native coronary artery without angina pectoris: I25.10

## 2023-04-29 HISTORY — DX: Hyperlipidemia, unspecified: E78.5

## 2023-04-29 HISTORY — DX: Cannabis use, unspecified, uncomplicated: F12.90

## 2023-04-29 HISTORY — DX: Tobacco use: Z72.0

## 2023-04-29 LAB — BASIC METABOLIC PANEL
Anion gap: 16 — ABNORMAL HIGH (ref 5–15)
BUN: 7 mg/dL (ref 6–20)
CO2: 20 mmol/L — ABNORMAL LOW (ref 22–32)
Calcium: 9.4 mg/dL (ref 8.9–10.3)
Chloride: 103 mmol/L (ref 98–111)
Creatinine, Ser: 0.56 mg/dL — ABNORMAL LOW (ref 0.61–1.24)
GFR, Estimated: >60 mL/min (ref >60)
Glucose, Bld: 99 mg/dL (ref 70–99)
Potassium: 3.5 mmol/L (ref 3.5–5.1)
Sodium: 139 mmol/L (ref 135–145)

## 2023-04-29 LAB — BLOOD GAS, VENOUS
Acid-Base Excess: 6.2 mmol/L — ABNORMAL HIGH (ref 0.0–2.0)
Bicarbonate: 29.5 mmol/L — ABNORMAL HIGH (ref 20.0–28.0)
O2 Saturation: 96.8 %
Patient temperature: 37
pCO2, Ven: 37 mm[Hg] — ABNORMAL LOW (ref 44–60)
pH, Ven: 7.51 — ABNORMAL HIGH (ref 7.25–7.43)
pO2, Ven: 71 mm[Hg] — ABNORMAL HIGH (ref 32–45)

## 2023-04-29 LAB — URINALYSIS, COMPLETE (UACMP) WITH MICROSCOPIC
Bacteria, UA: NONE SEEN
Bilirubin Urine: NEGATIVE
Glucose, UA: NEGATIVE mg/dL
Ketones, ur: 5 mg/dL — AB
Leukocytes,Ua: NEGATIVE
Nitrite: NEGATIVE
Protein, ur: 30 mg/dL — AB
Specific Gravity, Urine: 1.017 (ref 1.005–1.030)
pH: 5 (ref 5.0–8.0)

## 2023-04-29 LAB — CBC
HCT: 40.8 % (ref 39.0–52.0)
HCT: 39.6 % (ref 39.0–52.0)
Hemoglobin: 14.1 g/dL (ref 13.0–17.0)
Hemoglobin: 13.8 g/dL (ref 13.0–17.0)
MCH: 32.4 pg (ref 26.0–34.0)
MCH: 32.5 pg (ref 26.0–34.0)
MCHC: 34.6 g/dL (ref 30.0–36.0)
MCHC: 34.8 g/dL (ref 30.0–36.0)
MCV: 93.8 fL (ref 80.0–100.0)
MCV: 93.4 fL (ref 80.0–100.0)
Platelets: UNDETERMINED 10*3/uL (ref 150–400)
Platelets: 106 10*3/uL — ABNORMAL LOW (ref 150–400)
RBC: 4.35 MIL/uL (ref 4.22–5.81)
RBC: 4.24 MIL/uL (ref 4.22–5.81)
RDW: 13 % (ref 11.5–15.5)
RDW: 13 % (ref 11.5–15.5)
WBC: 6.1 10*3/uL (ref 4.0–10.5)
WBC: 7.6 10*3/uL (ref 4.0–10.5)
nRBC: 0 % (ref 0.0–0.2)
nRBC: 0 % (ref 0.0–0.2)

## 2023-04-29 LAB — ETHANOL: Alcohol, Ethyl (B): <10 mg/dL (ref <10)

## 2023-04-29 LAB — COMPREHENSIVE METABOLIC PANEL
ALT: 36 U/L (ref 0–44)
AST: 52 U/L — ABNORMAL HIGH (ref 15–41)
Albumin: 4.5 g/dL (ref 3.5–5.0)
Alkaline Phosphatase: 61 U/L (ref 38–126)
Anion gap: 13 (ref 5–15)
BUN: 6 mg/dL (ref 6–20)
CO2: 24 mmol/L (ref 22–32)
Calcium: 9.3 mg/dL (ref 8.9–10.3)
Chloride: 101 mmol/L (ref 98–111)
Creatinine, Ser: 0.67 mg/dL (ref 0.61–1.24)
GFR, Estimated: >60 mL/min (ref >60)
Glucose, Bld: 120 mg/dL — ABNORMAL HIGH (ref 70–99)
Potassium: 3.3 mmol/L — ABNORMAL LOW (ref 3.5–5.1)
Sodium: 138 mmol/L (ref 135–145)
Total Bilirubin: 2.2 mg/dL — ABNORMAL HIGH (ref 0.0–1.2)
Total Protein: 8.4 g/dL — ABNORMAL HIGH (ref 6.5–8.1)

## 2023-04-29 LAB — MAGNESIUM: Magnesium: 1.9 mg/dL (ref 1.7–2.4)

## 2023-04-29 LAB — AMMONIA: Ammonia: 28 umol/L (ref 9–35)

## 2023-04-29 LAB — RAPID URINE DRUG SCREEN, HOSP PERFORMED
Amphetamines: NOT DETECTED
Barbiturates: NOT DETECTED
Benzodiazepines: NOT DETECTED
Cocaine: NOT DETECTED
Opiates: NOT DETECTED
Tetrahydrocannabinol: POSITIVE — AB

## 2023-04-29 LAB — TSH: TSH: 2.486 u[IU]/mL (ref 0.350–4.500)

## 2023-04-29 LAB — PROCALCITONIN: Procalcitonin: <0.1 ng/mL

## 2023-04-29 LAB — TROPONIN I (HIGH SENSITIVITY)
Troponin I (High Sensitivity): 8 ng/L (ref <18)
Troponin I (High Sensitivity): 7 ng/L (ref <18)

## 2023-04-29 LAB — PROTIME-INR
INR: 0.9 (ref 0.8–1.2)
Prothrombin Time: 12.8 s (ref 11.4–15.2)

## 2023-04-29 LAB — CREATININE, URINE, RANDOM: Creatinine, Urine: 155 mg/dL

## 2023-04-29 LAB — PHOSPHORUS: Phosphorus: 3.4 mg/dL (ref 2.5–4.6)

## 2023-04-29 LAB — CK: Total CK: 186 U/L (ref 49–397)

## 2023-04-29 LAB — SARS CORONAVIRUS 2 BY RT PCR: SARS Coronavirus 2 by RT PCR: NEGATIVE

## 2023-04-29 MED ORDER — THIAMINE MONONITRATE 100 MG PO TABS
100.0000 mg | ORAL_TABLET | Freq: Every day | ORAL | Status: DC
  Administered 2023-04-29 – 2023-05-01 (×3): 100 mg via ORAL
  Filled 2023-04-29 (×3): qty 1

## 2023-04-29 MED ORDER — ACETAMINOPHEN 500 MG PO TABS
1000.0000 mg | ORAL_TABLET | Freq: Once | ORAL | Status: AC
  Administered 2023-04-29: 1000 mg via ORAL
  Filled 2023-04-29: qty 2

## 2023-04-29 MED ORDER — AMLODIPINE BESYLATE 5 MG PO TABS
5.0000 mg | ORAL_TABLET | Freq: Every day | ORAL | Status: DC
  Administered 2023-04-29: 5 mg via ORAL
  Filled 2023-04-29: qty 1

## 2023-04-29 MED ORDER — LORAZEPAM 2 MG/ML IJ SOLN: 0.0000 mg | Freq: Three times a day (TID) | INTRAMUSCULAR | Status: DC

## 2023-04-29 MED ORDER — PANTOPRAZOLE SODIUM 40 MG PO TBEC
40.0000 mg | DELAYED_RELEASE_TABLET | Freq: Every day | ORAL | Status: DC
  Administered 2023-04-30: 40 mg via ORAL
  Filled 2023-04-29: qty 1

## 2023-04-29 MED ORDER — THIAMINE HCL 100 MG/ML IJ SOLN: 100.0000 mg | Freq: Every day | INTRAMUSCULAR | Status: DC

## 2023-04-29 MED ORDER — NITROGLYCERIN 0.4 MG SL SUBL
0.4000 mg | SUBLINGUAL_TABLET | SUBLINGUAL | Status: DC | PRN
  Administered 2023-04-29: 0.4 mg via SUBLINGUAL
  Filled 2023-04-29: qty 1

## 2023-04-29 MED ORDER — FOLIC ACID 1 MG PO TABS
1.0000 mg | ORAL_TABLET | Freq: Every day | ORAL | Status: DC
  Administered 2023-04-29 – 2023-05-01 (×3): 1 mg via ORAL
  Filled 2023-04-29 (×3): qty 1

## 2023-04-29 MED ORDER — ADULT MULTIVITAMIN W/MINERALS CH
1.0000 | ORAL_TABLET | Freq: Every day | ORAL | Status: DC
  Administered 2023-04-29 – 2023-05-01 (×3): 1 via ORAL
  Filled 2023-04-29 (×3): qty 1

## 2023-04-29 MED ORDER — HYDRALAZINE HCL 20 MG/ML IJ SOLN: 10.0000 mg | INTRAMUSCULAR | Status: DC | PRN

## 2023-04-29 MED ORDER — FAMOTIDINE 20 MG PO TABS
20.0000 mg | ORAL_TABLET | Freq: Once | ORAL | Status: AC
  Administered 2023-04-29: 20 mg via ORAL
  Filled 2023-04-29: qty 1

## 2023-04-29 MED ORDER — LORAZEPAM 1 MG PO TABS
1.0000 mg | ORAL_TABLET | ORAL | Status: DC | PRN
  Administered 2023-04-30 – 2023-05-01 (×3): 1 mg via ORAL
  Filled 2023-04-29 (×4): qty 1

## 2023-04-29 MED ORDER — LORAZEPAM 2 MG/ML IJ SOLN
0.0000 mg | INTRAMUSCULAR | Status: DC
  Administered 2023-04-29 – 2023-04-30 (×3): 1 mg via INTRAVENOUS
  Filled 2023-04-29 (×2): qty 1

## 2023-04-29 MED ORDER — LORAZEPAM 2 MG/ML IJ SOLN: 1.0000 mg | INTRAMUSCULAR | Status: DC | PRN

## 2023-04-29 NOTE — Assessment & Plan Note (Signed)
 Repeat drug screen If still positive will need further discussion regarding cessation of cocaine

## 2023-04-29 NOTE — ED Triage Notes (Addendum)
 Pt reports L-sided chest pain and dizziness that started this morning. Pt was at [facility] within the last hr and had his blood pressure taken as 208/126, HR: 90. Pt reports being on olmesartan  and having dose increased to 40mg  last week. Compliant with medications at home.

## 2023-04-29 NOTE — Assessment & Plan Note (Signed)
 Not on statin recheck lipid panel

## 2023-04-29 NOTE — Assessment & Plan Note (Signed)
 Somewhat atypical.  Troponins unremarkable chest x-ray unremarkable Cardiology has been notified Chest pain in the setting of significant hypertension. Pain echogram continue to monitor on telemetry overnight

## 2023-04-29 NOTE — ED Notes (Signed)
 Patient is being discharged from the Urgent Care and sent to the Emergency Department via private vehicle. Patient's wife is going to transport him. Per [place] Raspet,PA patient is in need of higher level of care due to chest pain and elevated BP. Patient is aware and verbalizes understanding of plan of care.  Vitals:   04/29/23 1012 04/29/23 1014  BP: (!) 213/107   Pulse: 90   Resp: 18   Temp: 98.1 F (36.7 C)   SpO2: 95% 95%

## 2023-04-29 NOTE — Assessment & Plan Note (Signed)
 Restart home dose of Norvasc  and olmesartan  As needed hydralazine  as needed

## 2023-04-29 NOTE — ED Provider Triage Note (Signed)
 Emergency Medicine Provider Triage Evaluation Note  Derrick Watson , a 58 y.o. male  was evaluated in triage.  Pt complains of elevated BP.  Patient has had cold symptoms since November of last year.  Endorse having chest pressure, congestion, cough, and overall not feeling well.  He was taking NyQuil and DayQuil, but 2 weeks ago when he was seen in the urgent care center he was told to avoid OTC medication that can elevate his blood pressure as it was elevated.  He is taking different OTC medication for symptoms but today while walking, he felt a bit wobbly and unsteady.  He had his blood pressure checked at [facility] and it was quite high prompting urgent care visit.  He was told to come here for further assessment.  Review of Systems  Positive: As above Negative: As above  Physical Exam  BP (!) 202/120   Pulse 88   Temp 99.1 F (37.3 C)   Resp (!) 23   Ht 5\' 9"  (1.753 m)   Wt 81.2 kg   SpO2 99%   BMI 26.43 kg/m  Gen:   Awake, no distress   Resp:  Normal effort  MSK:   Moves extremities without difficulty  Other:    Medical Decision Making  Medically screening exam initiated at 11:55 AM.  Appropriate orders placed.  Tracey Grassi was informed that the remainder of the evaluation will be completed by another provider, this initial triage assessment does not replace that evaluation, and the importance of remaining in the ED until their evaluation is complete.     Debbra Fairy, PA-C 04/29/23 1158

## 2023-04-29 NOTE — Discharge Instructions (Signed)
 Given your high blood pressure with chest pain and shortness of breath I do recommend you go to the emergency room as we discussed.  Go directly to the ER for further evaluation and management.

## 2023-04-29 NOTE — ED Triage Notes (Signed)
 Sent by urgent care for SOB, chest pain, hypertension. BP high for several weeks. Got up this morning feeling dizzy and headache. Denies N/V/D.
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
## 2023-04-29 NOTE — Subjective & Objective (Signed)
 Pt with known hx of HTN and HLD His omersatan has just been increased for the past 3 days  Reports high BP for the past few months but for the past 2 weeks he has had CP off and on  it only lasts few seconds at a time He still smokes and drinks EtOH 3-4 beers a day He works as a Psychologist, occupational

## 2023-04-29 NOTE — Assessment & Plan Note (Signed)
 Monitor for any signs and symptoms of alcohol withdrawal.  Patient is somewhat tremulous.  Start CIWA protocol
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
## 2023-04-30 ENCOUNTER — Encounter (HOSPITAL_COMMUNITY): Payer: Self-pay | Admitting: Internal Medicine

## 2023-04-30 ENCOUNTER — Observation Stay (HOSPITAL_COMMUNITY): Payer: Medicaid Other

## 2023-04-30 DIAGNOSIS — R519 Headache, unspecified: Secondary | ICD-10-CM | POA: Diagnosis not present

## 2023-04-30 DIAGNOSIS — R079 Chest pain, unspecified: Secondary | ICD-10-CM

## 2023-04-30 DIAGNOSIS — E785 Hyperlipidemia, unspecified: Secondary | ICD-10-CM | POA: Diagnosis present

## 2023-04-30 DIAGNOSIS — F1210 Cannabis abuse, uncomplicated: Secondary | ICD-10-CM | POA: Diagnosis present

## 2023-04-30 DIAGNOSIS — R202 Paresthesia of skin: Secondary | ICD-10-CM | POA: Diagnosis present

## 2023-04-30 DIAGNOSIS — M5481 Occipital neuralgia: Secondary | ICD-10-CM | POA: Diagnosis present

## 2023-04-30 DIAGNOSIS — I2510 Atherosclerotic heart disease of native coronary artery without angina pectoris: Secondary | ICD-10-CM | POA: Diagnosis present

## 2023-04-30 DIAGNOSIS — Z7982 Long term (current) use of aspirin: Secondary | ICD-10-CM | POA: Diagnosis not present

## 2023-04-30 DIAGNOSIS — E876 Hypokalemia: Secondary | ICD-10-CM | POA: Diagnosis present

## 2023-04-30 DIAGNOSIS — F1010 Alcohol abuse, uncomplicated: Secondary | ICD-10-CM | POA: Diagnosis present

## 2023-04-30 DIAGNOSIS — F17290 Nicotine dependence, other tobacco product, uncomplicated: Secondary | ICD-10-CM | POA: Diagnosis present

## 2023-04-30 DIAGNOSIS — I700 Atherosclerosis of aorta: Secondary | ICD-10-CM | POA: Diagnosis present

## 2023-04-30 DIAGNOSIS — I2584 Coronary atherosclerosis due to calcified coronary lesion: Secondary | ICD-10-CM

## 2023-04-30 DIAGNOSIS — R071 Chest pain on breathing: Secondary | ICD-10-CM

## 2023-04-30 DIAGNOSIS — Z1152 Encounter for screening for COVID-19: Secondary | ICD-10-CM | POA: Diagnosis not present

## 2023-04-30 DIAGNOSIS — I252 Old myocardial infarction: Secondary | ICD-10-CM | POA: Diagnosis not present

## 2023-04-30 DIAGNOSIS — F1410 Cocaine abuse, uncomplicated: Secondary | ICD-10-CM | POA: Diagnosis present

## 2023-04-30 DIAGNOSIS — I160 Hypertensive urgency: Secondary | ICD-10-CM | POA: Diagnosis present

## 2023-04-30 DIAGNOSIS — I10 Essential (primary) hypertension: Secondary | ICD-10-CM | POA: Diagnosis present

## 2023-04-30 DIAGNOSIS — F17210 Nicotine dependence, cigarettes, uncomplicated: Secondary | ICD-10-CM | POA: Diagnosis present

## 2023-04-30 DIAGNOSIS — R200 Anesthesia of skin: Secondary | ICD-10-CM | POA: Diagnosis present

## 2023-04-30 DIAGNOSIS — Z79899 Other long term (current) drug therapy: Secondary | ICD-10-CM | POA: Diagnosis not present

## 2023-04-30 LAB — ECHOCARDIOGRAM COMPLETE
AR max vel: 2.24 cm2
AV Area VTI: 2.28 cm2
AV Area mean vel: 1.97 cm2
AV Mean grad: 3 mm[Hg]
AV Peak grad: 6.4 mm[Hg]
Ao pk vel: 1.26 m/s
Area-P 1/2: 2.9 cm2
Calc EF: 49.1 %
Height: 69 in
S' Lateral: 3 cm
Single Plane A2C EF: 50.5 %
Single Plane A4C EF: 50.4 %
Weight: 2864 [oz_av]

## 2023-04-30 LAB — CBC
HCT: 40.8 % (ref 39.0–52.0)
Hemoglobin: 14.3 g/dL (ref 13.0–17.0)
MCH: 32.6 pg (ref 26.0–34.0)
MCHC: 35 g/dL (ref 30.0–36.0)
MCV: 92.9 fL (ref 80.0–100.0)
Platelets: UNDETERMINED 10*3/uL (ref 150–400)
RBC: 4.39 MIL/uL (ref 4.22–5.81)
RDW: 12.6 % (ref 11.5–15.5)
WBC: 7.2 10*3/uL (ref 4.0–10.5)
nRBC: 0 % (ref 0.0–0.2)

## 2023-04-30 LAB — COMPREHENSIVE METABOLIC PANEL WITH GFR
ALT: 30 U/L (ref 0–44)
AST: 41 U/L (ref 15–41)
Albumin: 4.1 g/dL (ref 3.5–5.0)
Alkaline Phosphatase: 58 U/L (ref 38–126)
Anion gap: 10 (ref 5–15)
BUN: 14 mg/dL (ref 6–20)
CO2: 27 mmol/L (ref 22–32)
Calcium: 9.7 mg/dL (ref 8.9–10.3)
Chloride: 101 mmol/L (ref 98–111)
Creatinine, Ser: 0.78 mg/dL (ref 0.61–1.24)
GFR, Estimated: >60 mL/min
Glucose, Bld: 112 mg/dL — ABNORMAL HIGH (ref 70–99)
Potassium: 3.9 mmol/L (ref 3.5–5.1)
Sodium: 138 mmol/L (ref 135–145)
Total Bilirubin: 2.5 mg/dL — ABNORMAL HIGH (ref 0.0–1.2)
Total Protein: 7.7 g/dL (ref 6.5–8.1)

## 2023-04-30 LAB — LIPID PANEL
Cholesterol: 262 mg/dL — ABNORMAL HIGH (ref 0–200)
HDL: >135 mg/dL (ref >40)
Triglycerides: 100 mg/dL (ref <150)
VLDL: 20 mg/dL (ref 0–40)

## 2023-04-30 LAB — HIV ANTIBODY (ROUTINE TESTING W REFLEX): HIV Screen 4th Generation wRfx: NONREACTIVE

## 2023-04-30 LAB — MAGNESIUM: Magnesium: 2 mg/dL (ref 1.7–2.4)

## 2023-04-30 LAB — PHOSPHORUS: Phosphorus: 3.8 mg/dL (ref 2.5–4.6)

## 2023-04-30 MED ORDER — POTASSIUM CHLORIDE CRYS ER 20 MEQ PO TBCR
40.0000 meq | EXTENDED_RELEASE_TABLET | Freq: Once | ORAL | Status: AC
  Administered 2023-04-30: 40 meq via ORAL
  Filled 2023-04-30: qty 2

## 2023-04-30 MED ORDER — GUAIFENESIN ER 600 MG PO TB12
600.0000 mg | ORAL_TABLET | Freq: Two times a day (BID) | ORAL | Status: DC
  Administered 2023-04-30 – 2023-05-01 (×3): 600 mg via ORAL
  Filled 2023-04-30 (×3): qty 1

## 2023-04-30 MED ORDER — FAMOTIDINE 20 MG PO TABS
20.0000 mg | ORAL_TABLET | Freq: Once | ORAL | Status: AC
  Administered 2023-04-30: 20 mg via ORAL
  Filled 2023-04-30: qty 1

## 2023-04-30 MED ORDER — ALBUTEROL SULFATE (2.5 MG/3ML) 0.083% IN NEBU: 2.5000 mg | INHALATION_SOLUTION | RESPIRATORY_TRACT | Status: DC | PRN

## 2023-04-30 MED ORDER — HYDRALAZINE HCL 50 MG PO TABS
50.0000 mg | ORAL_TABLET | Freq: Three times a day (TID) | ORAL | Status: DC
  Administered 2023-04-30 – 2023-05-01 (×4): 50 mg via ORAL
  Filled 2023-04-30 (×4): qty 1

## 2023-04-30 MED ORDER — ONDANSETRON HCL 4 MG PO TABS: 4.0000 mg | ORAL_TABLET | Freq: Four times a day (QID) | ORAL | Status: DC | PRN

## 2023-04-30 MED ORDER — ASPIRIN 325 MG PO TBEC
325.0000 mg | DELAYED_RELEASE_TABLET | Freq: Every day | ORAL | Status: DC
  Administered 2023-04-30 – 2023-05-01 (×2): 325 mg via ORAL
  Filled 2023-04-30 (×2): qty 1

## 2023-04-30 MED ORDER — AMLODIPINE BESYLATE 5 MG PO TABS
10.0000 mg | ORAL_TABLET | Freq: Every day | ORAL | Status: DC
Start: 2023-04-30 — End: 2023-05-01
  Administered 2023-04-30 – 2023-05-01 (×2): 10 mg via ORAL
  Filled 2023-04-30 (×2): qty 2

## 2023-04-30 MED ORDER — NICOTINE 21 MG/24HR TD PT24
21.0000 mg | MEDICATED_PATCH | Freq: Every day | TRANSDERMAL | Status: DC
Start: 2023-04-30 — End: 2023-05-01
  Administered 2023-04-30 – 2023-05-01 (×2): 21 mg via TRANSDERMAL
  Filled 2023-04-30 (×2): qty 1

## 2023-04-30 MED ORDER — ENOXAPARIN SODIUM 40 MG/0.4ML IJ SOSY
40.0000 mg | PREFILLED_SYRINGE | INTRAMUSCULAR | Status: DC
Start: 2023-04-30 — End: 2023-05-01
  Administered 2023-04-30 – 2023-05-01 (×2): 40 mg via SUBCUTANEOUS
  Filled 2023-04-30 (×2): qty 0.4

## 2023-04-30 MED ORDER — IRBESARTAN 300 MG PO TABS
300.0000 mg | ORAL_TABLET | Freq: Every day | ORAL | Status: DC
Start: 2023-04-30 — End: 2023-05-01
  Administered 2023-04-30 – 2023-05-01 (×2): 300 mg via ORAL
  Filled 2023-04-30 (×2): qty 1

## 2023-04-30 MED ORDER — SODIUM CHLORIDE 0.9 % IV SOLN: INTRAVENOUS | Status: AC

## 2023-04-30 MED ORDER — ONDANSETRON HCL 4 MG/2ML IJ SOLN: 4.0000 mg | Freq: Four times a day (QID) | INTRAMUSCULAR | Status: DC | PRN

## 2023-04-30 NOTE — ED Notes (Signed)
IV was accidentally pulled out. New 20g IV placed in R. AC

## 2023-04-30 NOTE — Hospital Course (Addendum)
57 yom w/ HTN,HLD,Tobacco abuse, EtOH heavy use, occipital neuralgia presented with intermittent chest pain.  Patient olmesartan was increased recently 3 days PTA, has been having high blood pressure for past few months and past 2 weeks has had chest pain intermittent.  Patient does drink alcohol and smokes cigarettes, has history of withdrawals. In the ED blood pressure initially 202/120, labs with stable renal function total bili 2.2 Pro-Cal negative troponin 8> 7 TSH 2.4 COVID-negative UA with protein 30 alcohol less than 10 UDS positive for THC.  Cardiology was consulted patient blood pressure trending down after nitroglycerin s/l, and okay to manage at [facility] Patient admitted to [facility] underwent echocardiogram with normal EF positive WMA with mild inferior and inferior septal wall hypokinesis but Dr. Rennis Golden felt echo looked similar to previous, advised to continue amlodipine 10 mg hydralazine 50 mg 3 times daily and home ARB.  Blood pressure well-controlled.  TSH okay, to continue aspirin 81, cardiology plan for outpatient further workup
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
## 2023-04-30 NOTE — Progress Notes (Signed)
  Echocardiogram 2D Echocardiogram has been performed.  Ocie Doyne RDCS 04/30/2023, 3:15 PM

## 2023-04-30 NOTE — Progress Notes (Signed)
PROGRESS NOTE Derrick Watson  OZH:[id number] DOB: May 13, 1965 DOA: 04/29/2023 PCP: Pcp, No  Brief Narrative/Hospital Course: 57 yom w/ HTN,HLD,Tobacco abuse, EtOH heavy use, occipital neuralgia presented with intermittent chest pain.  Patient olmesartan was increased recently 3 days PTA, has been having high blood pressure for past few months and past 2 weeks has had chest pain intermittent.  Patient does drink alcohol and smokes cigarettes, has history of withdrawals. In the ED blood pressure initially 202/120, labs with stable renal function total bili 2.2 Pro-Cal negative troponin 8> 7 TSH 2.4 COVID-negative UA with protein 30 alcohol less than 10 UDS positive for THC.  Cardiology was consulted patient blood pressure trending down after nitroglycerin s/l, and okay to manage at [facility]         Subjective: Patient resting comfortably, fianc at the bedside  Some mild chest pain on left and left finger tinglings- ongoing since few weeks. Blood pressure at this time 180s/100  Assessment and Plan: Principal Problem:   Hypertensive urgency Active Problems:   Chest pain   Cocaine abuse (HCC)   Hyperlipidemia   Alcohol abuse   Tobacco abuse   Hypertension urgency Intermittent chest pain: Likely symptomatic hypertension.  Troponin negative.  Cardiology has been consulted, await further plan.  Increase amlodipine to 10 mg, continue home ARB cont hydralazine IV prn.  Monitor and adjust antihypertensives pending cardiology evaluation.  Alcohol abuse, at risk for withdrawal Continue CIWA Ativan thiamine folate and multivitamins.  Will provide counseling and resources for cessation  HLD: Lipid profile protein pending  Hypokalemia: Replaced.  Left arm numbness for few weeks month: Nonfocal on exam In the Setting of #1, Check B12.  Polysubstance abuse History of cocaine abuse: UDS positive for THC.  Provide resources for cessation.  DVT prophylaxis: enoxaparin (LOVENOX) injection 40  mg Start: 04/30/23 1030 SCDs Start: 04/30/23 0932 Code Status:   Code Status: Full Code Family Communication: plan of care discussed with patient/fianc at bedside. Patient status is: Remains hospitalized because of severity of illness Level of care: Progressive   Dispo: The patient is from: home            Anticipated disposition: TBD Objective: Vitals last 24 hrs: Vitals:   04/30/23 0630 04/30/23 0645 04/30/23 0909 04/30/23 0915  BP: (!) 158/85 (!) 150/106  (!) 188/100  Pulse: 86 80  78  Resp: 17 16  19   Temp:   98.3 F (36.8 C)   TempSrc:   Oral   SpO2: 99% 99%  98%  Weight:      Height:       Weight change:   Physical Examination: General exam: alert awake,at baseline, older than stated age HEENT:Oral mucosa moist, Ear/Nose WNL grossly Respiratory system: Bilaterally clear BS,no use of accessory muscle Cardiovascular system: S1 & S2 +, No JVD. Gastrointestinal system: Abdomen soft,NT,ND, BS+ Nervous System: Alert, awake, moving all extremities,and following commands. Extremities: LE edema neg,distal peripheral pulses palpable and warm.  Skin: No rashes,no icterus. MSK: Normal muscle bulk,tone, power   Medications reviewed:  Scheduled Meds:  amLODipine  10 mg Oral Daily   aspirin EC  325 mg Oral Daily   enoxaparin (LOVENOX) injection  40 mg Subcutaneous Q24H   folic acid  1 mg Oral Daily   guaiFENesin  600 mg Oral BID   irbesartan  300 mg Oral Daily   LORazepam  0-4 mg Intravenous Q4H   Followed by   Melene Muller ON 05/01/2023] LORazepam  0-4 mg Intravenous Q8H   multivitamin with  minerals  1 tablet Oral Daily   nicotine  21 mg Transdermal Daily   pantoprazole  40 mg Oral Q1200   thiamine  100 mg Oral Daily   Or   thiamine  100 mg Intravenous Daily   Continuous Infusions:  sodium chloride        Diet Order             Diet Heart Room service appropriate? Yes; Fluid consistency: Thin  Diet effective now                  No intake or output data in the  24 hours ending 04/30/23 1016 Net IO Since Admission: No IO data has been entered for this period [04/30/23 1016]  Wt Readings from Last 3 Encounters:  04/29/23 81.2 kg  12/25/22 81.6 kg  12/16/22 81.6 kg     Unresulted Labs (From admission, onward)     Start     Ordered   04/30/23 0932  HIV Antibody (routine testing w rflx)  (HIV Antibody (Routine testing w reflex) panel)  Once,   R        04/30/23 0932   04/30/23 0932  Magnesium  Tomorrow morning,   R        04/30/23 0932   04/30/23 0932  Phosphorus  Tomorrow morning,   R        04/30/23 0932   04/30/23 0932  Comprehensive metabolic panel  Tomorrow morning,   R       Question:  Release to patient  Answer:  Immediate   04/30/23 0932   04/30/23 0932  CBC  Tomorrow morning,   R       Question:  Release to patient  Answer:  Immediate   04/30/23 0932   04/30/23 0500  Lipoprotein A (LPA)  Tomorrow morning,   R        04/29/23 1952   04/29/23 1857  Vitamin B1  Add-on,   AD        04/29/23 1856          Data Reviewed: I have personally reviewed following labs and imaging studies CBC: Recent Labs  Lab 04/29/23 1208 04/29/23 1933  WBC 6.1 7.6  HGB 14.1 13.8  HCT 40.8 39.6  MCV 93.8 93.4  PLT PLATELET CLUMPS NOTED ON SMEAR, UNABLE TO ESTIMATE 106*   Basic Metabolic Panel:  Recent Labs  Lab 04/29/23 1208 04/29/23 1855  NA 139 138  K 3.5 3.3*  CL 103 101  CO2 20* 24  GLUCOSE 99 120*  BUN 7 6  CREATININE 0.56* 0.67  CALCIUM 9.4 9.3  MG  --  1.9  PHOS  --  3.4   GFR: Estimated Creatinine Clearance: 100.6 mL/min (by C-G formula based on SCr of 0.67 mg/dL). Liver Function Tests:  Recent Labs  Lab 04/29/23 1855  AST 52*  ALT 36  ALKPHOS 61  BILITOT 2.2*  PROT 8.4*  ALBUMIN 4.5   No results for input(s): "LIPASE", "AMYLASE" in the last 168 hours.  Recent Labs  Lab 04/29/23 2000  AMMONIA 28   Coagulation Profile:  Recent Labs  Lab 04/29/23 1855  INR 0.9   No results for input(s): "PROBNP" in the last  168 hours.  No results for input(s): "HGBA1C" in the last 72 hours. No results for input(s): "GLUCAP" in the last 168 hours. Recent Labs    04/30/23 0520  CHOL 262*  HDL >135  LDLCALC NOT CALCULATED  TRIG 100  CHOLHDL  NOT CALCULATED   Recent Labs    04/29/23 1857  TSH 2.486   Sepsis Labs: Recent Labs  Lab 04/29/23 1933  PROCALCITON <0.10   Recent Results (from the past 240 hours)  SARS Coronavirus 2 by RT PCR (hospital order, performed in [facility] hospital lab) *cepheid single result test*     Status: None   Collection Time: 04/29/23  7:16 PM   Specimen: Nasal Swab  Result Value Ref Range Status   SARS Coronavirus 2 by RT PCR NEGATIVE NEGATIVE Final    Comment: (NOTE) SARS-CoV-2 target nucleic acids are NOT DETECTED.  The SARS-CoV-2 RNA is generally detectable in upper and lower respiratory specimens during the acute phase of infection. The lowest concentration of SARS-CoV-2 viral copies this assay can detect is 250 copies / mL. A negative result does not preclude SARS-CoV-2 infection and should not be used as the sole basis for treatment or other patient management decisions.  A negative result may occur with improper specimen collection / handling, submission of specimen other than nasopharyngeal swab, presence of viral mutation(s) within the areas targeted by this assay, and inadequate number of viral copies (<250 copies / mL). A negative result must be combined with clinical observations, patient history, and epidemiological information.  Fact Sheet for Patients:   RoadLapTop.co.za  Fact Sheet for Healthcare Providers: http://kim-miller.com/  This test is not yet approved or  cleared by the [place] FDA and has been authorized for detection and/or diagnosis of SARS-CoV-2 by FDA under an Emergency Use Authorization (EUA).  This EUA will remain in effect (meaning this test can be used) for the duration of  the COVID-19 declaration under Section 564(b)(1) of the Act, 21 U.S.C. section 360bbb-3(b)(1), unless the authorization is terminated or revoked sooner.  Performed at [facility], [address], [place], [place] [address]     Antimicrobials/Microbiology: Anti-infectives (From admission, onward)    None         Component Value Date/Time   SDES THROAT 10/01/2022 0915   SPECREQUEST NONE 10/01/2022 0915   CULT  10/01/2022 0915    NO GROUP A STREP (S.PYOGENES) ISOLATED Performed at [facility], [address], [place], [place] [address]    REPTSTATUS 10/04/2022 FINAL 10/01/2022 0915     Radiology Studies: DG Chest 2 View Result Date: 04/29/2023 CLINICAL DATA:  Chest pain. EXAM: CHEST - 2 VIEW COMPARISON:  04/20/2023. FINDINGS: Bilateral lung fields are clear. Bilateral costophrenic angles are clear. Normal cardio-mediastinal silhouette. No acute osseous abnormalities. The soft tissues are within normal limits. Redemonstration of multiple metallic BB markers. IMPRESSION: No active cardiopulmonary disease. Electronically Signed   By: Jules Schick M.D.   On: 04/29/2023 14:36    LOS: 0 days   Total time spent in review of labs and imaging, patient evaluation, formulation of plan, documentation and communication with family: 35 minutes  Lanae Boast, MD  [facility]  04/30/2023, 10:16 AM

## 2023-04-30 NOTE — Plan of Care (Signed)
  Problem: Education: Goal: Knowledge of General Education information will improve Description: Including pain rating scale, medication(s)/side effects and non-pharmacologic comfort measures Outcome: Completed/Met

## 2023-05-01 ENCOUNTER — Other Ambulatory Visit (HOSPITAL_COMMUNITY): Payer: Self-pay

## 2023-05-01 ENCOUNTER — Telehealth: Payer: Self-pay | Admitting: Physician Assistant

## 2023-05-01 DIAGNOSIS — R079 Chest pain, unspecified: Secondary | ICD-10-CM

## 2023-05-01 MED ORDER — BUTALBITAL-APAP-CAFFEINE 50-325-40 MG PO TABS: 1.0000 | ORAL_TABLET | Freq: Four times a day (QID) | ORAL | Status: DC | PRN

## 2023-05-01 MED ORDER — HYDROXYZINE HCL 25 MG PO TABS
25.0000 mg | ORAL_TABLET | Freq: Three times a day (TID) | ORAL | Status: DC | PRN
  Administered 2023-05-01: 25 mg via ORAL
  Filled 2023-05-01: qty 1

## 2023-05-01 MED ORDER — ASPIRIN 81 MG PO TBEC: 81.0000 mg | DELAYED_RELEASE_TABLET | Freq: Every day | ORAL | Status: DC

## 2023-05-01 MED ORDER — HYDRALAZINE HCL 50 MG PO TABS
50.0000 mg | ORAL_TABLET | Freq: Three times a day (TID) | ORAL | 0 refills | Status: DC
  Filled 2023-05-01: qty 90, 30d supply, fill #0

## 2023-05-01 MED ORDER — AMLODIPINE BESYLATE 10 MG PO TABS
10.0000 mg | ORAL_TABLET | Freq: Every day | ORAL | 0 refills | Status: DC
  Filled 2023-05-01: qty 30, 30d supply, fill #0
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
## 2023-05-01 NOTE — Progress Notes (Signed)
I will send a message to the office to arrange outpatient cardiac PET scan. Consented in today's rounding note. Attestation order placed.

## 2023-05-01 NOTE — Telephone Encounter (Signed)
Hi triage team, Dr. Rennis Golden saw this patient inpatient and recommends outpatient cardiac PET, diagnosis chest pain/precordial pain. Can you help place order and arrange? I consented patient during rounds today and will also place attestation. Unfortunately does not have any insurance this may be limiting factor. I already arranged post-hospital f/u. I know the PET will likely be after this (if able to be scheduled). Thank you.

## 2023-05-01 NOTE — Progress Notes (Signed)
TOC  discharge meds in  a secure bag delivered to the pt in the room

## 2023-05-01 NOTE — Progress Notes (Signed)
Patient to be discharged to home today. All Discharge instructions including all discharge Medications and schedules for these Medications reviewed with the Patient and Family. Understanding verbalized by the Patient. Discharge AVS along with Medications delivered from the outpatient Pharmacy with the Patient at discharge.

## 2023-05-01 NOTE — Telephone Encounter (Signed)
Orders placed for Cardiac PET as ordered. Left message on patient personal cell phone

## 2023-05-01 NOTE — TOC Transition Note (Signed)
Transition of Care [facility]) - Discharge Note   Patient Details  Name: Derrick Watson MRN: [id number] Date of Birth: November 04, 1965  [facility] CM/SW Contact:  Lanier Clam, RN Phone Number: 05/01/2023, 3:02 PM   Clinical Narrative:    MATCH MEDICATION ASSISTANCE CARD Pharmacies please call [phone] for claim processing assistance.  Rx BIN: [id number] Rx Group: [id number] Rx PCN: [id number] Relationship Code: 1 Person Code: 01  Patient ID (MRN): MOSES [id number]    Patient Name: [name]   Patient HKV:[id number]   Discharge Date:05/01/2023  Expiration Date:05/08/2023 (must be filled within 7 days of discharge)      Final next level of care: Home/Self Care Barriers to Discharge: No Barriers Identified   Patient Goals and CMS Choice            Discharge Placement                       Discharge Plan and Services Additional resources added to the After Visit Summary for                                       Social Drivers of Health (SDOH) Interventions SDOH Screenings   Food Insecurity: No Food Insecurity (04/30/2023)  Housing: Low Risk  (04/30/2023)  Transportation Needs: No Transportation Needs (04/30/2023)  Utilities: Not At Risk (04/30/2023)  Tobacco Use: Medium Risk (04/30/2023)     Readmission Risk Interventions     No data to display
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
## 2023-05-05 LAB — LIPOPROTEIN A (LPA): Lipoprotein (a): 102.4 nmol/L — ABNORMAL HIGH (ref <75.0)

## 2023-05-08 ENCOUNTER — Ambulatory Visit: Payer: Medicaid Other | Admitting: Internal Medicine

## 2023-05-09 LAB — VITAMIN B1: Vitamin B1 (Thiamine): 90.1 nmol/L (ref 66.5–200.0)
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
## 2023-05-14 ENCOUNTER — Ambulatory Visit: Payer: MEDICAID | Attending: Cardiology | Admitting: Cardiology

## 2023-05-14 DIAGNOSIS — E782 Mixed hyperlipidemia: Secondary | ICD-10-CM

## 2023-05-14 DIAGNOSIS — R072 Precordial pain: Secondary | ICD-10-CM

## 2023-05-14 DIAGNOSIS — I10 Essential (primary) hypertension: Secondary | ICD-10-CM

## 2023-06-22 ENCOUNTER — Encounter (HOSPITAL_COMMUNITY): Payer: Self-pay

## 2023-06-23 ENCOUNTER — Telehealth (HOSPITAL_COMMUNITY): Payer: Self-pay | Admitting: *Deleted

## 2023-06-23 ENCOUNTER — Telehealth: Payer: Self-pay | Admitting: Cardiovascular Disease

## 2023-06-23 NOTE — Telephone Encounter (Signed)
 Pt c/o BP issue: STAT if pt c/o blurred vision, one-sided weakness or slurred speech  1. What are your last 5 BP readings? 250/140; 220/114; 208/117;   2. Are you having any other symptoms (ex. Dizziness, headache, blurred vision, passed out)? Passing out   3. What is your BP issue?  Running high

## 2023-06-23 NOTE — Telephone Encounter (Addendum)
 Patient identification verified by 2 forms. Shade Flood, RN     Called and spoke to patient  Patient states:  - Experiencing headache and dizziness today.  - BP this morning after taking medication was 220/114 HR 93. - Bps from 3/10 was 250/140 and on 3/9 was 208/110.  - experienced chest pain last night that was relieved by sitting upright in bed.   Patient denies:  - blurred vision, SOB, chest pain at time of call, one-sided weakness, syncope             Interventions/Plan: - Encounter reviewed with DOD ([place]). Per DOD patient to increase Hydralazine HCL to 100 mg by mouth TID.  - Patient was on hold while this RN spoke with DOD. Patient disconnected call while waiting. Tried calling patient to relay DOD recommendations but he did not answer. LDVMTCB.  - Patient also needs OV for BP check

## 2023-06-23 NOTE — Telephone Encounter (Signed)
 Attempted to call patient regarding upcoming cardiac PET appointment. Left message on voicemail with name and callback number Johney Frame RN Navigator Cardiac Imaging [facility] [phone] Office  Advised to avoid caffeine for 12 hours prior to test.

## 2023-06-23 NOTE — Telephone Encounter (Signed)
 2nd attempt to call patient with DOD recommendations. No answer. LDVMTCB

## 2023-06-23 NOTE — Telephone Encounter (Signed)
 Reaching out to patient to offer assistance regarding upcoming cardiac imaging study; pt verbalizes understanding of appt date/time, parking situation and where to check in, pre-test NPO status name and call back number provided for further questions should they arise  Larey Brick RN Navigator Cardiac Imaging [facility] [phone] office [phone] cell  Patient aware to avoid caffeine 12 hours prior to his cardiac PET scan.

## 2023-06-24 ENCOUNTER — Ambulatory Visit (HOSPITAL_COMMUNITY): Admission: RE | Admit: 2023-06-24 | Payer: Medicaid Other | Source: Ambulatory Visit

## 2023-06-24 NOTE — Telephone Encounter (Signed)
 Patient identification verified by 2 forms. Marilynn Rail, RN    Called and spoke to patient  Patient states:   -BP has been running high for 1 month   -had chest pain last night, sat up to sleep   -BP this morning at 4:00am 220/114 Hr: 83  -took Olmesartan and Hydralazine at 11:00am   -at 11:30am BP 119/111 Hr: 83  -Takes Hydralazine 50mg  every 8 hours  -Takes Olmesartan 40mg  daily   -he is not taking amlodipine due to causing chest pain  Patient denies:   -SOB/difficulty breathing  Patient scheduled for OV 3/17 at 3:35pm  Reviewed ED warning signs/precautions  Patient verbalized understanding, no questions at this time

## 2023-06-24 NOTE — Telephone Encounter (Signed)
 Runell Gess, MD  You3 minutes ago (1:35 PM)    I have not seen him in the office in over a year and a half.  Will reevaluate at next office visit.   Attempted to call patient, no answer left message requesting a call back.

## 2023-06-25 NOTE — Telephone Encounter (Signed)
 2nd attempt to call patient, no answer left message requesting a call back.
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
## 2023-06-26 NOTE — Telephone Encounter (Signed)
 3rd attempt to call patient. No answer left detailed message advising patient to keep 3/17 OV, keep BP log and take medications as prescribed.

## 2023-06-29 ENCOUNTER — Ambulatory Visit: Payer: MEDICAID | Attending: Cardiology | Admitting: Cardiology

## 2023-06-29 ENCOUNTER — Telehealth (HOSPITAL_COMMUNITY): Payer: Self-pay | Admitting: *Deleted

## 2023-06-29 NOTE — Telephone Encounter (Signed)
 Attempted to call patient regarding upcoming cardiac PET appointment. Left message on voicemail with name and callback number Johney Frame RN Navigator Cardiac Imaging [facility] [phone] Office  Advised to avoid caffeine for 12 hours prior to test.

## 2023-06-30 ENCOUNTER — Encounter (HOSPITAL_COMMUNITY)

## 2023-08-14 ENCOUNTER — Ambulatory Visit
Admission: EM | Admit: 2023-08-14 | Discharge: 2023-08-14 | Disposition: A | Discharge status: Home / Self Care | Attending: Family Medicine | Admitting: Family Medicine

## 2023-08-14 ENCOUNTER — Ambulatory Visit (INDEPENDENT_AMBULATORY_CARE_PROVIDER_SITE_OTHER)

## 2023-08-14 DIAGNOSIS — R051 Acute cough: Secondary | ICD-10-CM

## 2023-08-14 DIAGNOSIS — B349 Viral infection, unspecified: Secondary | ICD-10-CM | POA: Diagnosis not present

## 2023-08-14 DIAGNOSIS — R062 Wheezing: Secondary | ICD-10-CM

## 2023-08-14 LAB — POC COVID19/FLU A&B COMBO
Covid Antigen, POC: NEGATIVE
Influenza A Antigen, POC: NEGATIVE
Influenza B Antigen, POC: NEGATIVE

## 2023-08-14 LAB — POCT RAPID STREP A (OFFICE): Rapid Strep A Screen: NEGATIVE

## 2023-08-14 MED ORDER — BENZONATATE 200 MG PO CAPS: 200.0000 mg | ORAL_CAPSULE | Freq: Three times a day (TID) | ORAL | 0 refills | Status: DC | PRN

## 2023-08-14 MED ORDER — IPRATROPIUM-ALBUTEROL 0.5-2.5 (3) MG/3ML IN SOLN
3.0000 mL | Freq: Once | RESPIRATORY_TRACT | Status: AC
  Administered 2023-08-14: 3 mL via RESPIRATORY_TRACT

## 2023-08-14 MED ORDER — PREDNISONE 20 MG PO TABS: 40.0000 mg | ORAL_TABLET | Freq: Every day | ORAL | 0 refills | Status: AC

## 2023-08-14 NOTE — ED Triage Notes (Signed)
 Pt present at [facility] with c/o sore throat and productive cough x three days.
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
## 2023-08-14 NOTE — Discharge Instructions (Addendum)
 You have tested negative for COVID, flu, and strep throat. Start tessalon  as needed for cough.  Start prednisone  daily for 5 days.  Lots of rest and fluids.  Please follow-up with your PCP in 2 to 3 days for recheck.  Please go to the ER for any worsening symptoms.  Hope you feel better soon!

## 2023-08-28 IMAGING — DX DG SHOULDER 2+V*L*
4 series · 4 of 4 positions shown · non-contrast
Comparison: Chest radiograph 10/20/2018

CLINICAL DATA: Left shoulder pain for several days

EXAM:
LEFT SHOULDER - 2+ VIEW

[shoulder ap]
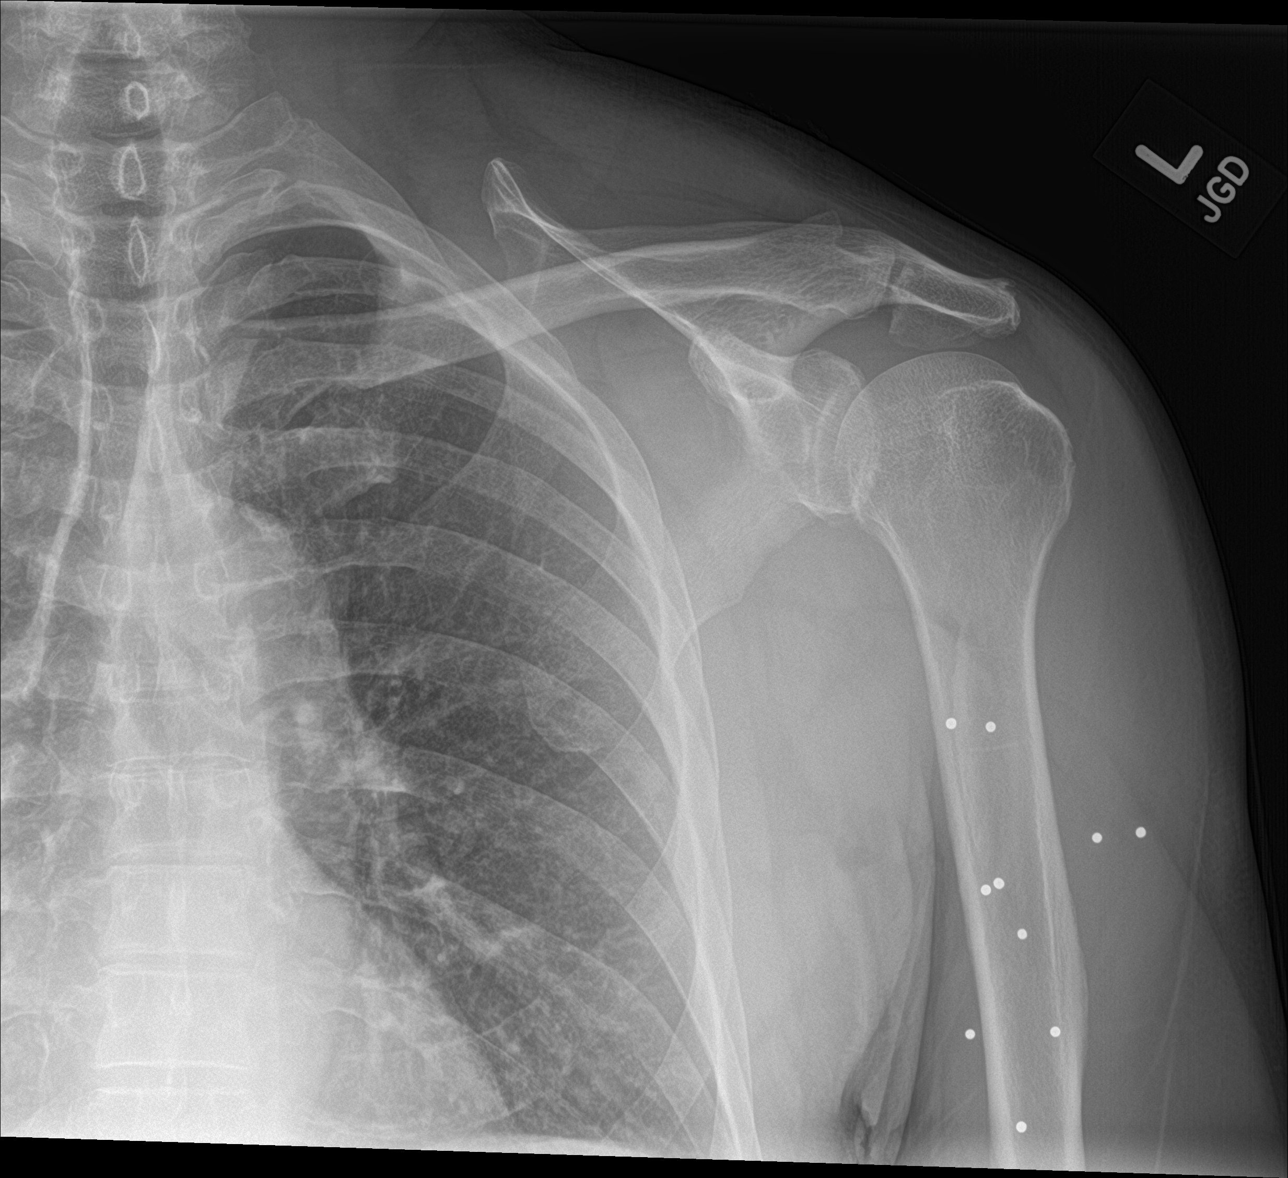

[shoulder grashey]
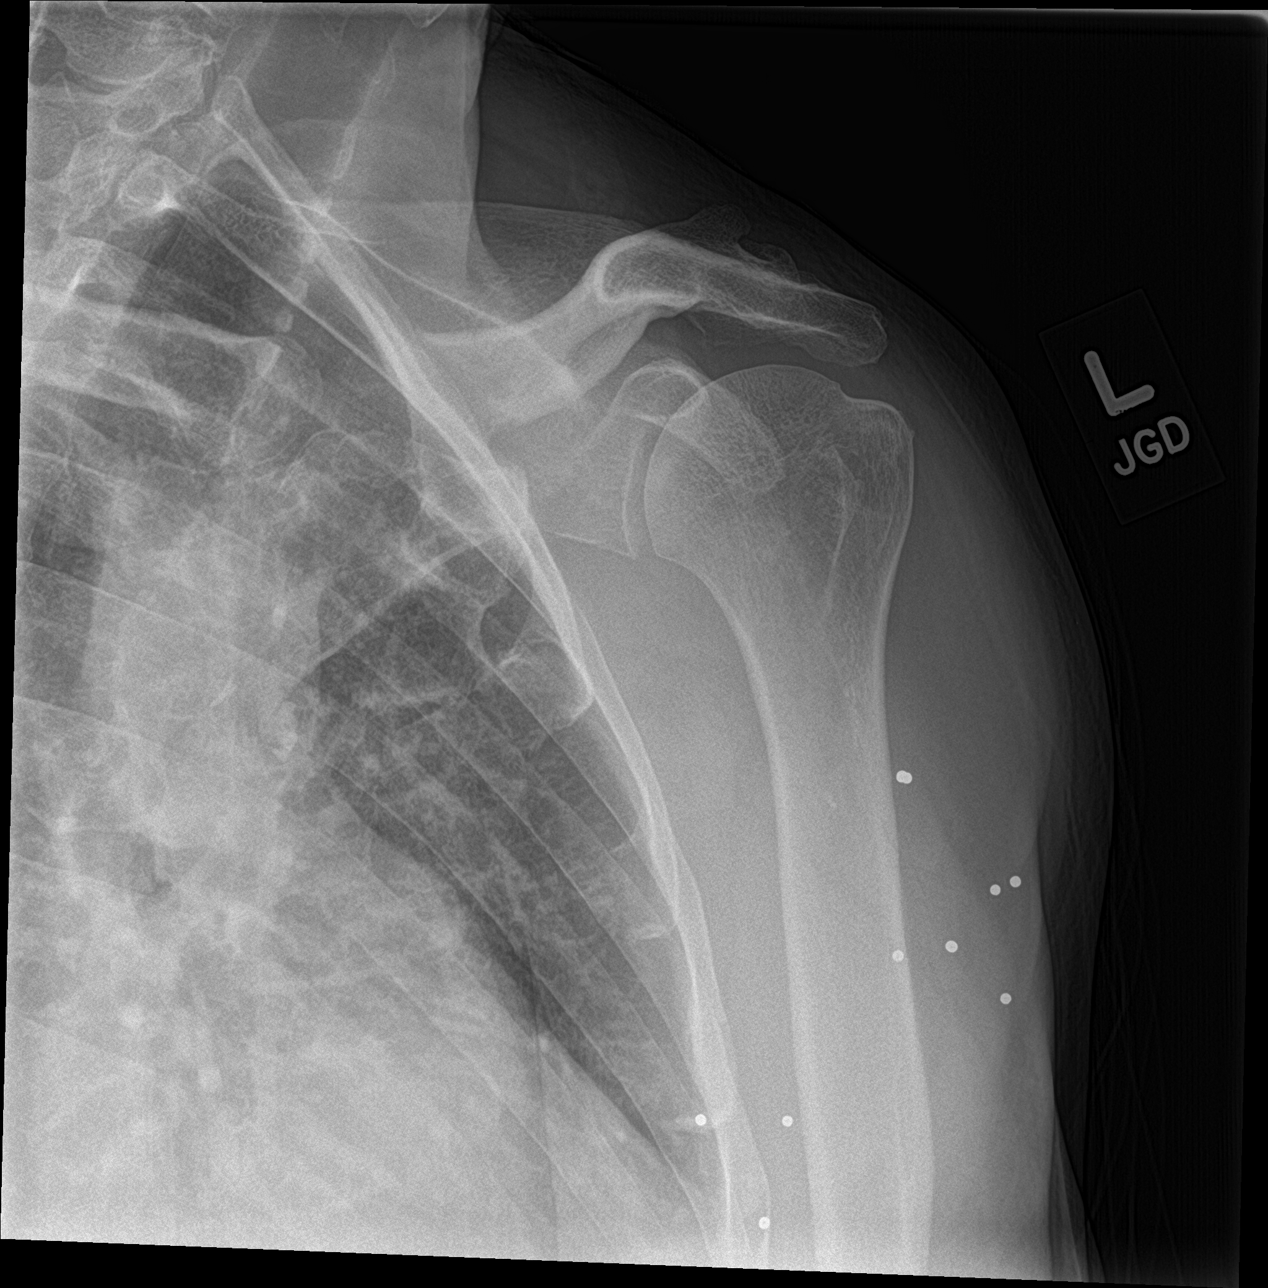

[shoulder y-view]
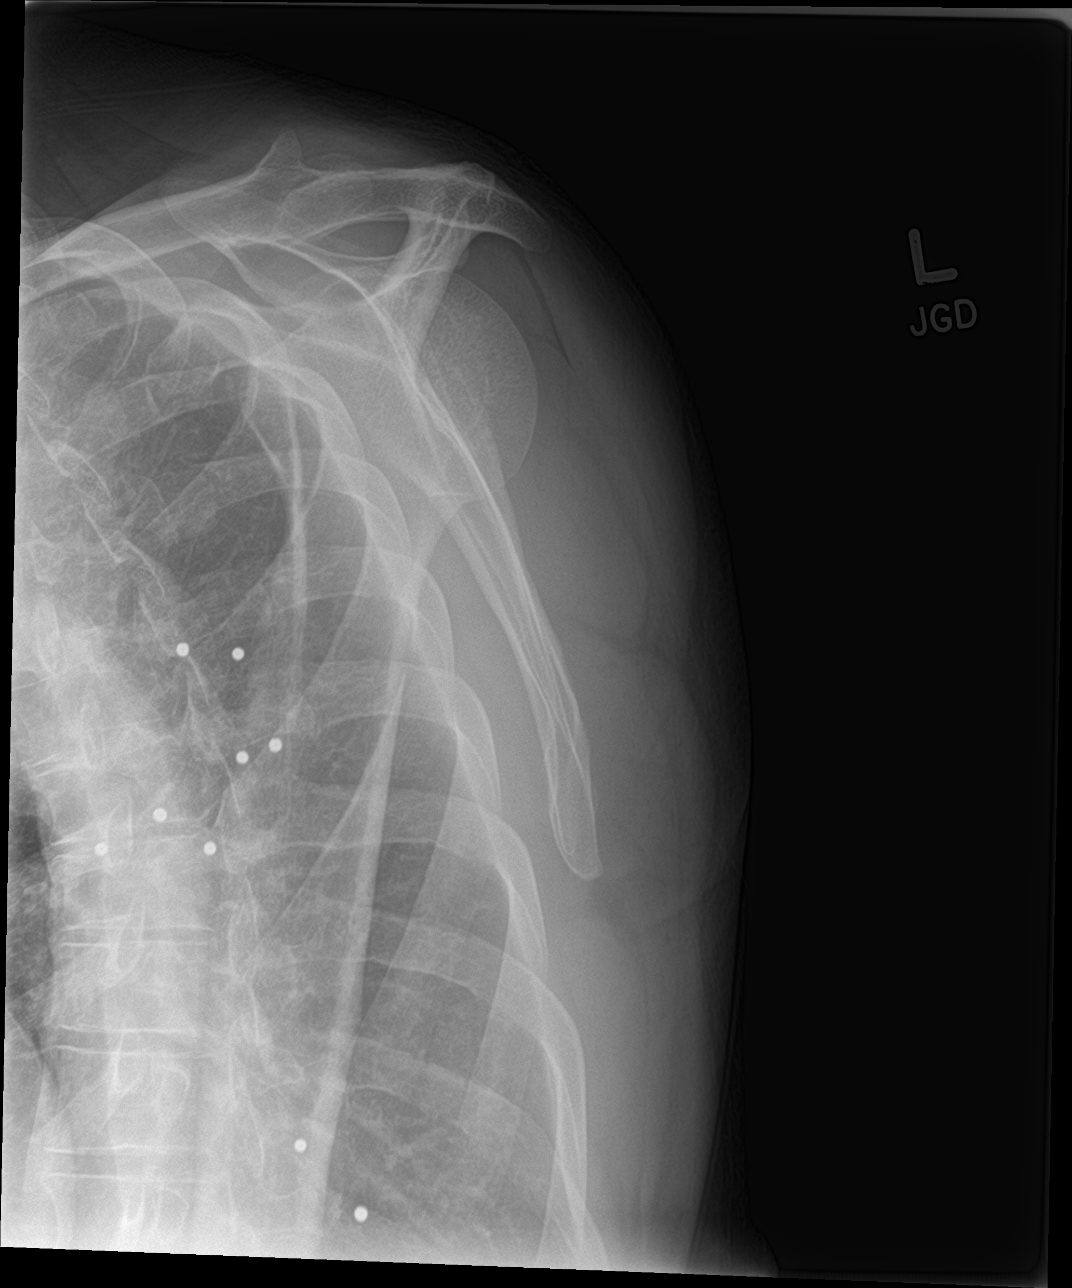

[shoulder axial]
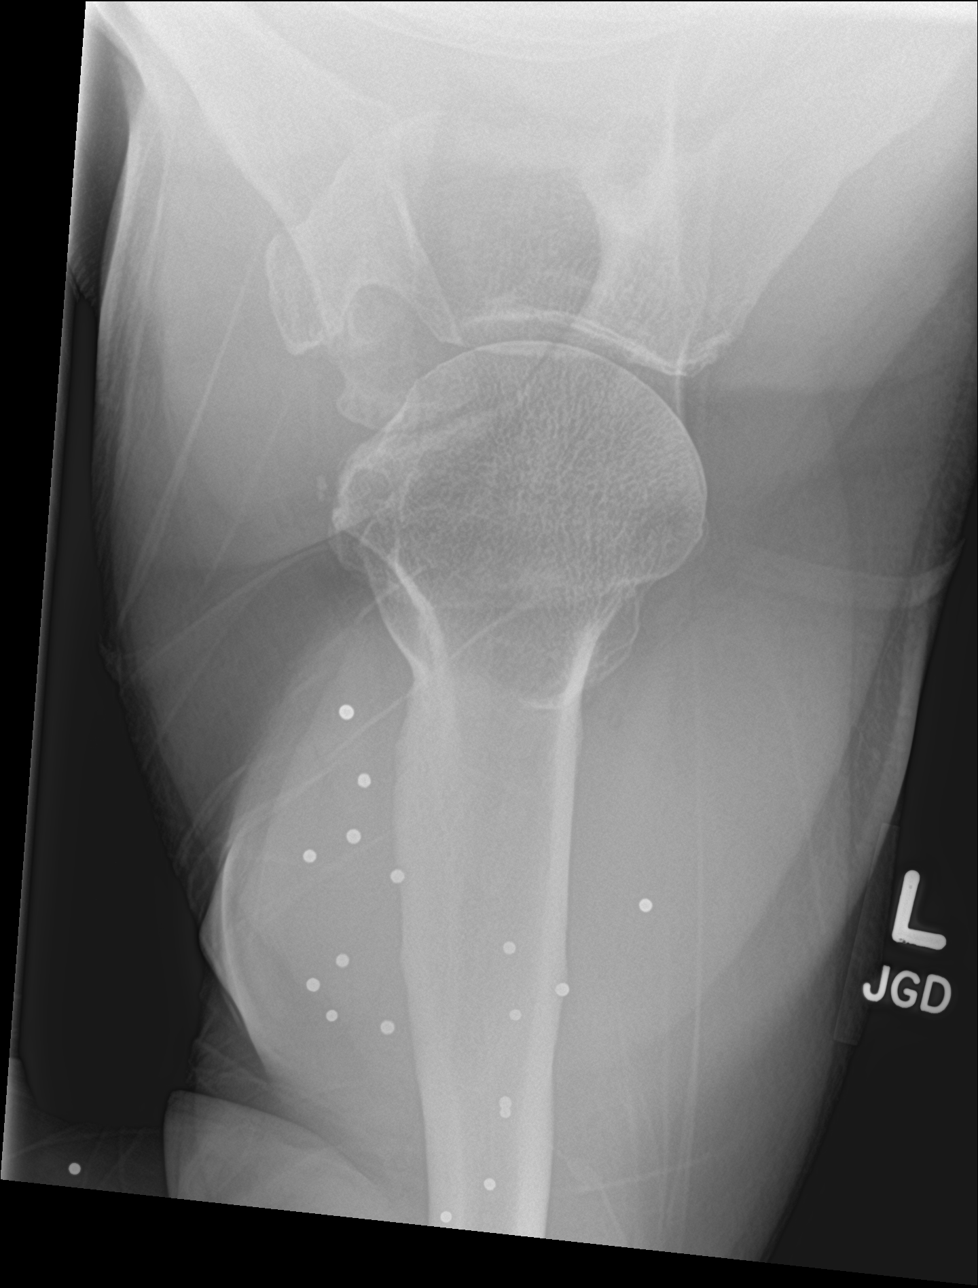

[4 of 4 positions shown; findings below may reference images not displayed]

FINDINGS: There is no acute fracture or dislocation. Glenohumeral and
acromioclavicular alignment is maintained. There is mild
degenerative change at the AC joint and no significant degenerative
change of the glenohumeral joint. The soft tissues are unremarkable.
IMPRESSION: 1. No acute fracture or dislocation.
2. Mild degenerative change of the AC joint.

## 2023-09-15 ENCOUNTER — Ambulatory Visit (HOSPITAL_COMMUNITY): Admission: RE | Admit: 2023-09-15 | Source: Ambulatory Visit

## 2023-09-17 ENCOUNTER — Ambulatory Visit (HOSPITAL_COMMUNITY): Payer: Self-pay

## 2023-09-17 ENCOUNTER — Ambulatory Visit

## 2023-09-17 ENCOUNTER — Ambulatory Visit
Admission: EM | Admit: 2023-09-17 | Discharge: 2023-09-17 | Disposition: A | Discharge status: Home / Self Care | Attending: Family Medicine | Admitting: Family Medicine

## 2023-09-17 DIAGNOSIS — S8391XA Sprain of unspecified site of right knee, initial encounter: Secondary | ICD-10-CM | POA: Diagnosis not present

## 2023-09-17 DIAGNOSIS — S82831A Other fracture of upper and lower end of right fibula, initial encounter for closed fracture: Secondary | ICD-10-CM | POA: Diagnosis not present

## 2023-09-17 DIAGNOSIS — M25561 Pain in right knee: Secondary | ICD-10-CM | POA: Diagnosis not present

## 2023-09-17 MED ORDER — HYDROCODONE-ACETAMINOPHEN 10-325 MG PO TABS: 1.0000 | ORAL_TABLET | Freq: Four times a day (QID) | ORAL | 0 refills | Status: DC | PRN

## 2023-09-17 NOTE — Discharge Instructions (Addendum)
 Please keep your right leg in the fiberglass splint that was placed while in the clinic.  You may elevate and ice as needed.  A prescription for pain medication has been sent to your pharmacy.  This medication does contain Tylenol  so do not take any additional Tylenol  while you are taking this medication.  You may take over-the-counter ibuprofen  as needed.  Do not bear any weight on the right leg until you are seen by orthopedics.  Please contact orthopedics to make a follow-up appointment ASAP.  I also provided you with neurology contact information per your request for your headaches.  Please go to the emergency room if you develop any worsening symptoms prior to seeing orthopedics.  This includes but is not limited to uncontrolled pain or swelling, persistent numbness or tingling, or any new concerns that arise.  I hope you feel better soon!  Please follow-up with your PCP in 1 week.
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
## 2023-09-17 NOTE — ED Triage Notes (Signed)
 Pt presents to UC for c/o right ankle and knee pain since last night after twisting them from falling down 4 steps at his house. Pt took 1 of his wife's oxycodone  w/o relief.

## 2023-09-18 ENCOUNTER — Encounter (HOSPITAL_BASED_OUTPATIENT_CLINIC_OR_DEPARTMENT_OTHER): Payer: Self-pay | Admitting: Student

## 2023-09-18 ENCOUNTER — Ambulatory Visit (HOSPITAL_BASED_OUTPATIENT_CLINIC_OR_DEPARTMENT_OTHER): Admitting: Student

## 2023-09-18 DIAGNOSIS — M25561 Pain in right knee: Secondary | ICD-10-CM

## 2023-09-18 DIAGNOSIS — S82831A Other fracture of upper and lower end of right fibula, initial encounter for closed fracture: Secondary | ICD-10-CM

## 2023-09-18 DIAGNOSIS — S82301A Unspecified fracture of lower end of right tibia, initial encounter for closed fracture: Secondary | ICD-10-CM | POA: Diagnosis not present
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
## 2023-10-23 ENCOUNTER — Ambulatory Visit (HOSPITAL_BASED_OUTPATIENT_CLINIC_OR_DEPARTMENT_OTHER)

## 2023-10-23 ENCOUNTER — Ambulatory Visit (HOSPITAL_BASED_OUTPATIENT_CLINIC_OR_DEPARTMENT_OTHER): Admitting: Student

## 2023-10-23 ENCOUNTER — Encounter (HOSPITAL_BASED_OUTPATIENT_CLINIC_OR_DEPARTMENT_OTHER): Payer: Self-pay | Admitting: Student

## 2023-10-23 DIAGNOSIS — S82831A Other fracture of upper and lower end of right fibula, initial encounter for closed fracture: Secondary | ICD-10-CM | POA: Diagnosis not present

## 2023-10-23 DIAGNOSIS — M25561 Pain in right knee: Secondary | ICD-10-CM

## 2023-10-23 MED ORDER — MELOXICAM 15 MG PO TABS: 15.0000 mg | ORAL_TABLET | Freq: Every day | ORAL | 0 refills | Status: AC
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
## 2023-11-13 ENCOUNTER — Ambulatory Visit (HOSPITAL_BASED_OUTPATIENT_CLINIC_OR_DEPARTMENT_OTHER): Admitting: Physician Assistant

## 2023-11-13 ENCOUNTER — Ambulatory Visit (INDEPENDENT_AMBULATORY_CARE_PROVIDER_SITE_OTHER)

## 2023-11-13 ENCOUNTER — Encounter (HOSPITAL_BASED_OUTPATIENT_CLINIC_OR_DEPARTMENT_OTHER): Payer: Self-pay | Admitting: Physician Assistant

## 2023-11-13 DIAGNOSIS — M25561 Pain in right knee: Secondary | ICD-10-CM | POA: Diagnosis not present

## 2023-11-13 DIAGNOSIS — S82831A Other fracture of upper and lower end of right fibula, initial encounter for closed fracture: Secondary | ICD-10-CM | POA: Diagnosis not present
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
## 2023-11-19 ENCOUNTER — Other Ambulatory Visit: Payer: Self-pay

## 2023-11-19 ENCOUNTER — Other Ambulatory Visit (HOSPITAL_BASED_OUTPATIENT_CLINIC_OR_DEPARTMENT_OTHER): Payer: Self-pay

## 2023-11-19 ENCOUNTER — Ambulatory Visit: Attending: Physician Assistant

## 2023-11-19 DIAGNOSIS — S82831A Other fracture of upper and lower end of right fibula, initial encounter for closed fracture: Secondary | ICD-10-CM | POA: Diagnosis not present

## 2023-11-19 DIAGNOSIS — M6281 Muscle weakness (generalized): Secondary | ICD-10-CM | POA: Insufficient documentation

## 2023-11-19 DIAGNOSIS — M25561 Pain in right knee: Secondary | ICD-10-CM | POA: Diagnosis present

## 2023-11-19 DIAGNOSIS — G8929 Other chronic pain: Secondary | ICD-10-CM | POA: Insufficient documentation

## 2023-12-02 ENCOUNTER — Other Ambulatory Visit (HOSPITAL_COMMUNITY)
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
## 2023-12-11 ENCOUNTER — Ambulatory Visit

## 2023-12-15 ENCOUNTER — Ambulatory Visit (HOSPITAL_BASED_OUTPATIENT_CLINIC_OR_DEPARTMENT_OTHER): Admitting: Student

## 2023-12-16 ENCOUNTER — Ambulatory Visit (HOSPITAL_BASED_OUTPATIENT_CLINIC_OR_DEPARTMENT_OTHER): Admitting: Student
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
## 2023-12-30 ENCOUNTER — Ambulatory Visit: Attending: Physician Assistant

## 2023-12-30 DIAGNOSIS — M6281 Muscle weakness (generalized): Secondary | ICD-10-CM | POA: Insufficient documentation

## 2023-12-30 DIAGNOSIS — G8929 Other chronic pain: Secondary | ICD-10-CM | POA: Diagnosis present

## 2023-12-30 DIAGNOSIS — M25561 Pain in right knee: Secondary | ICD-10-CM | POA: Insufficient documentation
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
## 2024-01-12 ENCOUNTER — Ambulatory Visit
Admission: EM | Admit: 2024-01-12 | Discharge: 2024-01-12 | Disposition: A | Discharge status: Home / Self Care | Attending: Family Medicine | Admitting: Family Medicine

## 2024-01-12 ENCOUNTER — Other Ambulatory Visit: Payer: Self-pay

## 2024-01-12 ENCOUNTER — Ambulatory Visit

## 2024-01-12 DIAGNOSIS — J209 Acute bronchitis, unspecified: Secondary | ICD-10-CM

## 2024-01-12 DIAGNOSIS — R9431 Abnormal electrocardiogram [ECG] [EKG]: Secondary | ICD-10-CM

## 2024-01-12 DIAGNOSIS — I10 Essential (primary) hypertension: Secondary | ICD-10-CM

## 2024-01-12 DIAGNOSIS — R062 Wheezing: Secondary | ICD-10-CM | POA: Diagnosis not present

## 2024-01-12 HISTORY — DX: Unspecified convulsions: R56.9

## 2024-01-12 MED ORDER — ALBUTEROL SULFATE HFA 108 (90 BASE) MCG/ACT IN AERS: 1.0000 | INHALATION_SPRAY | Freq: Four times a day (QID) | RESPIRATORY_TRACT | 0 refills | Status: AC | PRN

## 2024-01-12 MED ORDER — ALBUTEROL SULFATE (2.5 MG/3ML) 0.083% IN NEBU: 2.5000 mg | INHALATION_SOLUTION | Freq: Four times a day (QID) | RESPIRATORY_TRACT | 0 refills | Status: AC | PRN

## 2024-01-12 MED ORDER — AZITHROMYCIN 250 MG PO TABS: 250.0000 mg | ORAL_TABLET | Freq: Every day | ORAL | 0 refills | Status: DC

## 2024-01-12 MED ORDER — AMOXICILLIN-POT CLAVULANATE 875-125 MG PO TABS: 1.0000 | ORAL_TABLET | Freq: Two times a day (BID) | ORAL | 0 refills | Status: DC

## 2024-01-12 MED ORDER — BENZONATATE 100 MG PO CAPS: 100.0000 mg | ORAL_CAPSULE | Freq: Three times a day (TID) | ORAL | 0 refills | Status: AC

## 2024-01-12 NOTE — ED Notes (Signed)
 Pt states he has not taken his BP medicine today.
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
## 2024-01-12 NOTE — Discharge Instructions (Addendum)
 I have sent in antibiotics for your bronchitis BUT given your prolonged QTc on your EKG as well as your uncontrolled high blood pressure my medical advice is that you go to the emergency room ASAP for further evaluation.   Start Augmentin antibiotic as prescribed.  You may take Tessalon  3 times a day as needed for your cough.  I have refilled your albuterol  inhaler and home nebulizer solution to use as needed for wheezing or shortness of breath. Please follow-up with your PCP at your scheduled appointment on October 8.

## 2024-01-12 NOTE — ED Triage Notes (Signed)
 Pt present with c/o a sore throat x one week. Pt states he has done salt and warm water gargles.

## 2024-01-13 ENCOUNTER — Other Ambulatory Visit: Payer: Self-pay

## 2024-01-13 ENCOUNTER — Emergency Department (HOSPITAL_COMMUNITY)
Admission: EM | Admit: 2024-01-13 | Discharge: 2024-01-13 | Disposition: A | Discharge status: Home / Self Care | Source: Ambulatory Visit | Attending: Emergency Medicine | Admitting: Emergency Medicine

## 2024-01-13 ENCOUNTER — Encounter (HOSPITAL_COMMUNITY): Payer: Self-pay

## 2024-01-13 ENCOUNTER — Emergency Department (HOSPITAL_COMMUNITY)

## 2024-01-13 DIAGNOSIS — E876 Hypokalemia: Secondary | ICD-10-CM | POA: Insufficient documentation

## 2024-01-13 DIAGNOSIS — Z87891 Personal history of nicotine dependence: Secondary | ICD-10-CM | POA: Insufficient documentation

## 2024-01-13 DIAGNOSIS — I2510 Atherosclerotic heart disease of native coronary artery without angina pectoris: Secondary | ICD-10-CM | POA: Insufficient documentation

## 2024-01-13 DIAGNOSIS — Z79899 Other long term (current) drug therapy: Secondary | ICD-10-CM | POA: Diagnosis not present

## 2024-01-13 DIAGNOSIS — I10 Essential (primary) hypertension: Secondary | ICD-10-CM | POA: Diagnosis present

## 2024-01-13 LAB — CBC WITH DIFFERENTIAL/PLATELET
Abs Immature Granulocytes: 0.02 K/uL (ref 0.00–0.07)
Basophils Absolute: 0.1 K/uL (ref 0.0–0.1)
Basophils Relative: 1 %
Eosinophils Absolute: 0 K/uL (ref 0.0–0.5)
Eosinophils Relative: 1 %
HCT: 37.9 % — ABNORMAL LOW (ref 39.0–52.0)
Hemoglobin: 13.1 g/dL (ref 13.0–17.0)
Immature Granulocytes: 0 %
Lymphocytes Relative: 36 %
Lymphs Abs: 2.2 K/uL (ref 0.7–4.0)
MCH: 32 pg (ref 26.0–34.0)
MCHC: 34.6 g/dL (ref 30.0–36.0)
MCV: 92.7 fL (ref 80.0–100.0)
Monocytes Absolute: 0.3 K/uL (ref 0.1–1.0)
Monocytes Relative: 6 %
Neutro Abs: 3.3 K/uL (ref 1.7–7.7)
Neutrophils Relative %: 56 %
Platelets: 151 K/uL (ref 150–400)
RBC: 4.09 MIL/uL — ABNORMAL LOW (ref 4.22–5.81)
RDW: 15.6 % — ABNORMAL HIGH (ref 11.5–15.5)
WBC: 6 K/uL (ref 4.0–10.5)
nRBC: 0.3 % — ABNORMAL HIGH (ref 0.0–0.2)

## 2024-01-13 LAB — BASIC METABOLIC PANEL WITH GFR
Anion gap: 20 — ABNORMAL HIGH (ref 5–15)
BUN: 6 mg/dL (ref 6–20)
CO2: 21 mmol/L — ABNORMAL LOW (ref 22–32)
Calcium: 8.8 mg/dL — ABNORMAL LOW (ref 8.9–10.3)
Chloride: 99 mmol/L (ref 98–111)
Creatinine, Ser: 0.7 mg/dL (ref 0.61–1.24)
GFR, Estimated: >60 mL/min (ref >60)
Glucose, Bld: 95 mg/dL (ref 70–99)
Potassium: 3.2 mmol/L — ABNORMAL LOW (ref 3.5–5.1)
Sodium: 140 mmol/L (ref 135–145)

## 2024-01-13 LAB — TROPONIN T, HIGH SENSITIVITY: Troponin T High Sensitivity: <15 ng/L (ref 0–19)

## 2024-01-13 LAB — MAGNESIUM: Magnesium: 1.8 mg/dL (ref 1.7–2.4)

## 2024-01-13 MED ORDER — POTASSIUM CHLORIDE CRYS ER 20 MEQ PO TBCR
40.0000 meq | EXTENDED_RELEASE_TABLET | Freq: Once | ORAL | Status: AC
  Administered 2024-01-13: 40 meq via ORAL
  Filled 2024-01-13: qty 2

## 2024-01-13 MED ORDER — MAGNESIUM OXIDE -MG SUPPLEMENT 400 (240 MG) MG PO TABS
400.0000 mg | ORAL_TABLET | Freq: Once | ORAL | Status: AC
  Administered 2024-01-13: 400 mg via ORAL
  Filled 2024-01-13: qty 1

## 2024-01-13 MED ORDER — AMLODIPINE BESYLATE 10 MG PO TABS: 10.0000 mg | ORAL_TABLET | Freq: Every day | ORAL | 0 refills | Status: DC

## 2024-01-13 NOTE — Discharge Instructions (Signed)

## 2024-01-13 NOTE — ED Triage Notes (Signed)
 Pt presents to ED from home C/O hypertension. States was seen yesterday at [facility] for sore throat, and was advised to come to ED for evaluation of incidental high blood pressure. Denies CP, SOB, headache.
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
## 2024-01-15 ENCOUNTER — Ambulatory Visit
Admission: EM | Admit: 2024-01-15 | Discharge: 2024-01-15 | Disposition: A | Discharge status: Home / Self Care | Attending: Family Medicine | Admitting: Family Medicine

## 2024-01-15 DIAGNOSIS — I10 Essential (primary) hypertension: Secondary | ICD-10-CM | POA: Diagnosis not present

## 2024-01-15 NOTE — Discharge Instructions (Signed)
 Continue checking your blood pressure twice daily and continue your olmesartan  and amlodipine  as prescribed.  The clinical contact you with the results of the blood work done today and if your potassium is normal we will send in hydrochlorothiazide  that you can add on to your regimen.  Please follow-up with your PCP at your appointment that is scheduled on October 8.  Reduce your salt intake as well.  Please go to the ER for any worsening symptoms that occur before you see your PCP.  I hope you feel better soon!
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
## 2024-01-15 NOTE — ED Triage Notes (Signed)
 Pt present with c/o hypertension. Pt states his BP was checked at work and was told to be seen. States he was placed on Amlodipine  and is concerned his BP is still high.

## 2024-01-16 LAB — BASIC METABOLIC PANEL WITH GFR
BUN/Creatinine Ratio: 7 — ABNORMAL LOW (ref 9–20)
BUN: 5 mg/dL — ABNORMAL LOW (ref 6–24)
CO2: 22 mmol/L (ref 20–29)
Calcium: 8.8 mg/dL (ref 8.7–10.2)
Chloride: 102 mmol/L (ref 96–106)
Creatinine, Ser: 0.68 mg/dL — ABNORMAL LOW (ref 0.76–1.27)
Glucose: 102 mg/dL — ABNORMAL HIGH (ref 70–99)
Potassium: 3.5 mmol/L (ref 3.5–5.2)
Sodium: 143 mmol/L (ref 134–144)
eGFR: 107 mL/min/1.73 (ref >59)

## 2024-01-18 ENCOUNTER — Ambulatory Visit (HOSPITAL_COMMUNITY): Payer: Self-pay

## 2024-01-18 MED ORDER — HYDROCHLOROTHIAZIDE 25 MG PO TABS: 25.0000 mg | ORAL_TABLET | Freq: Every morning | ORAL | 2 refills | Status: DC

## 2024-01-18 NOTE — Telephone Encounter (Signed)
 Potassium and sodium are normal.  Prescription for hydrochlorothiazide  sent to pharmacy as planned by provider.

## 2024-01-19 ENCOUNTER — Telehealth: Payer: Self-pay | Admitting: Student

## 2024-01-19 ENCOUNTER — Encounter (HOSPITAL_BASED_OUTPATIENT_CLINIC_OR_DEPARTMENT_OTHER): Payer: Self-pay

## 2024-01-19 NOTE — Telephone Encounter (Signed)
Note completed and given to pt

## 2024-01-19 NOTE — Telephone Encounter (Signed)
**Note De-identified  [place] Obfuscation** Please advise 
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
## 2024-01-19 NOTE — Telephone Encounter (Signed)
 Patient needs a doctors note because he stared a new job on Sept 23 2025 and they need something saying he does not have any restrictions

## 2024-01-21 ENCOUNTER — Encounter: Payer: Self-pay | Admitting: Student

## 2024-01-21 ENCOUNTER — Other Ambulatory Visit: Payer: Self-pay

## 2024-01-21 ENCOUNTER — Ambulatory Visit: Admitting: Student

## 2024-01-21 VITALS — BP 162/92 | HR 91 | Temp 97.9°F | Resp 16 | Ht 70.0 in | Wt 164.4 lb

## 2024-01-21 DIAGNOSIS — J449 Chronic obstructive pulmonary disease, unspecified: Secondary | ICD-10-CM | POA: Insufficient documentation

## 2024-01-21 DIAGNOSIS — R935 Abnormal findings on diagnostic imaging of other abdominal regions, including retroperitoneum: Secondary | ICD-10-CM | POA: Insufficient documentation

## 2024-01-21 DIAGNOSIS — G8929 Other chronic pain: Secondary | ICD-10-CM

## 2024-01-21 DIAGNOSIS — I10 Essential (primary) hypertension: Secondary | ICD-10-CM

## 2024-01-21 DIAGNOSIS — K219 Gastro-esophageal reflux disease without esophagitis: Secondary | ICD-10-CM

## 2024-01-21 DIAGNOSIS — R519 Headache, unspecified: Secondary | ICD-10-CM

## 2024-01-21 DIAGNOSIS — M542 Cervicalgia: Secondary | ICD-10-CM

## 2024-01-21 DIAGNOSIS — B181 Chronic viral hepatitis B without delta-agent: Secondary | ICD-10-CM | POA: Insufficient documentation

## 2024-01-21 DIAGNOSIS — F1011 Alcohol abuse, in remission: Secondary | ICD-10-CM

## 2024-01-21 DIAGNOSIS — Z7689 Persons encountering health services in other specified circumstances: Secondary | ICD-10-CM | POA: Insufficient documentation

## 2024-01-21 DIAGNOSIS — Z720 Tobacco use: Secondary | ICD-10-CM

## 2024-01-21 DIAGNOSIS — S12400A Unspecified displaced fracture of fifth cervical vertebra, initial encounter for closed fracture: Secondary | ICD-10-CM | POA: Insufficient documentation

## 2024-01-21 MED ORDER — TRELEGY ELLIPTA 200-62.5-25 MCG/ACT IN AEPB: 1.0000 | INHALATION_SPRAY | Freq: Every day | RESPIRATORY_TRACT | Status: DC

## 2024-01-21 MED ORDER — OMEPRAZOLE 20 MG PO CPDR: 40.0000 mg | DELAYED_RELEASE_CAPSULE | Freq: Every day | ORAL | 0 refills | Status: AC

## 2024-01-21 MED ORDER — OLMESARTAN-AMLODIPINE-HCTZ 40-10-25 MG PO TABS: 1.0000 | ORAL_TABLET | Freq: Every day | ORAL | 0 refills | Status: DC

## 2024-01-29 ENCOUNTER — Ambulatory Visit: Payer: Self-pay
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
## 2024-02-15 ENCOUNTER — Encounter: Payer: Self-pay | Admitting: Radiology
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
## 2024-02-16 ENCOUNTER — Telehealth: Payer: Self-pay

## 2024-02-16 ENCOUNTER — Ambulatory Visit: Attending: Physician Assistant

## 2024-02-16 NOTE — Telephone Encounter (Signed)
 LVM re; 02/16/24 no show appt. Pt can schedule another appt as needed. Will Dc if no contact from th pt in 2 weeks.

## 2024-02-17 ENCOUNTER — Encounter: Payer: Self-pay | Admitting: *Deleted
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
## 2024-02-19 ENCOUNTER — Ambulatory Visit: Admitting: Student

## 2024-02-19 DIAGNOSIS — Z720 Tobacco use: Secondary | ICD-10-CM

## 2024-02-19 DIAGNOSIS — Z0000 Encounter for general adult medical examination without abnormal findings: Secondary | ICD-10-CM

## 2024-02-19 DIAGNOSIS — F1011 Alcohol abuse, in remission: Secondary | ICD-10-CM

## 2024-02-19 DIAGNOSIS — G8929 Other chronic pain: Secondary | ICD-10-CM

## 2024-02-19 DIAGNOSIS — Z91199 Patient's noncompliance with other medical treatment and regimen due to unspecified reason: Secondary | ICD-10-CM

## 2024-02-19 DIAGNOSIS — M542 Cervicalgia: Secondary | ICD-10-CM

## 2024-02-19 DIAGNOSIS — I10 Essential (primary) hypertension: Secondary | ICD-10-CM

## 2024-02-19 DIAGNOSIS — E782 Mixed hyperlipidemia: Secondary | ICD-10-CM

## 2024-02-23 ENCOUNTER — Other Ambulatory Visit: Payer: Self-pay

## 2024-02-23 ENCOUNTER — Observation Stay (HOSPITAL_COMMUNITY)
Admit: 2024-02-23 | Discharge: 2024-02-23 | Disposition: A | Discharge status: Home / Self Care | Attending: Internal Medicine | Admitting: Internal Medicine

## 2024-02-23 ENCOUNTER — Emergency Department (HOSPITAL_COMMUNITY)

## 2024-02-23 ENCOUNTER — Observation Stay (HOSPITAL_COMMUNITY)

## 2024-02-23 ENCOUNTER — Observation Stay (HOSPITAL_COMMUNITY)
Admission: EM | Admit: 2024-02-23 | Discharge: 2024-02-24 | Disposition: A | Attending: Internal Medicine | Admitting: Internal Medicine

## 2024-02-23 DIAGNOSIS — K529 Noninfective gastroenteritis and colitis, unspecified: Secondary | ICD-10-CM | POA: Insufficient documentation

## 2024-02-23 DIAGNOSIS — E876 Hypokalemia: Secondary | ICD-10-CM | POA: Diagnosis not present

## 2024-02-23 DIAGNOSIS — R569 Unspecified convulsions: Principal | ICD-10-CM

## 2024-02-23 DIAGNOSIS — I10 Essential (primary) hypertension: Secondary | ICD-10-CM | POA: Diagnosis present

## 2024-02-23 DIAGNOSIS — F17210 Nicotine dependence, cigarettes, uncomplicated: Secondary | ICD-10-CM | POA: Diagnosis not present

## 2024-02-23 DIAGNOSIS — I2510 Atherosclerotic heart disease of native coronary artery without angina pectoris: Secondary | ICD-10-CM | POA: Insufficient documentation

## 2024-02-23 DIAGNOSIS — G4089 Other seizures: Secondary | ICD-10-CM | POA: Diagnosis present

## 2024-02-23 DIAGNOSIS — R059 Cough, unspecified: Secondary | ICD-10-CM | POA: Insufficient documentation

## 2024-02-23 DIAGNOSIS — F10139 Alcohol abuse with withdrawal, unspecified: Secondary | ICD-10-CM | POA: Insufficient documentation

## 2024-02-23 DIAGNOSIS — G9341 Metabolic encephalopathy: Secondary | ICD-10-CM | POA: Insufficient documentation

## 2024-02-23 DIAGNOSIS — Z7982 Long term (current) use of aspirin: Secondary | ICD-10-CM | POA: Diagnosis not present

## 2024-02-23 LAB — COMPREHENSIVE METABOLIC PANEL WITH GFR
ALT: 30 U/L (ref 0–44)
AST: 51 U/L — ABNORMAL HIGH (ref 15–41)
Albumin: 4.4 g/dL (ref 3.5–5.0)
Alkaline Phosphatase: 82 U/L (ref 38–126)
Anion gap: 18 — ABNORMAL HIGH (ref 5–15)
BUN: <5 mg/dL — ABNORMAL LOW (ref 6–20)
CO2: 25 mmol/L (ref 22–32)
Calcium: 9.3 mg/dL (ref 8.9–10.3)
Chloride: 95 mmol/L — ABNORMAL LOW (ref 98–111)
Creatinine, Ser: 0.99 mg/dL (ref 0.61–1.24)
GFR, Estimated: >60 mL/min (ref >60)
Glucose, Bld: 224 mg/dL — ABNORMAL HIGH (ref 70–99)
Potassium: 2.4 mmol/L — CL (ref 3.5–5.1)
Sodium: 137 mmol/L (ref 135–145)
Total Bilirubin: 1.7 mg/dL — ABNORMAL HIGH (ref 0.0–1.2)
Total Protein: 7.9 g/dL (ref 6.5–8.1)

## 2024-02-23 LAB — CBC WITH DIFFERENTIAL/PLATELET
Abs Immature Granulocytes: 0.02 K/uL (ref 0.00–0.07)
Basophils Absolute: 0 K/uL (ref 0.0–0.1)
Basophils Relative: 1 %
Eosinophils Absolute: 0.1 K/uL (ref 0.0–0.5)
Eosinophils Relative: 1 %
HCT: 33.8 % — ABNORMAL LOW (ref 39.0–52.0)
Hemoglobin: 12.1 g/dL — ABNORMAL LOW (ref 13.0–17.0)
Immature Granulocytes: 0 %
Lymphocytes Relative: 26 %
Lymphs Abs: 1.9 K/uL (ref 0.7–4.0)
MCH: 33.6 pg (ref 26.0–34.0)
MCHC: 35.8 g/dL (ref 30.0–36.0)
MCV: 93.9 fL (ref 80.0–100.0)
Monocytes Absolute: 0.5 K/uL (ref 0.1–1.0)
Monocytes Relative: 8 %
Neutro Abs: 4.6 K/uL (ref 1.7–7.7)
Neutrophils Relative %: 64 %
Platelets: DECREASED K/uL (ref 150–400)
RBC: 3.6 MIL/uL — ABNORMAL LOW (ref 4.22–5.81)
RDW: 13 % (ref 11.5–15.5)
Smear Review: NORMAL
WBC: 7.1 K/uL (ref 4.0–10.5)
nRBC: 0.3 % — ABNORMAL HIGH (ref 0.0–0.2)

## 2024-02-23 LAB — ETHANOL: Alcohol, Ethyl (B): <15 mg/dL (ref <15)

## 2024-02-23 LAB — MAGNESIUM: Magnesium: 1.3 mg/dL — ABNORMAL LOW (ref 1.7–2.4)

## 2024-02-23 LAB — URINE DRUG SCREEN
Amphetamines: NEGATIVE
Barbiturates: NEGATIVE
Benzodiazepines: NEGATIVE
Cocaine: NEGATIVE
Fentanyl: NEGATIVE
Methadone Scn, Ur: NEGATIVE
Opiates: NEGATIVE
Tetrahydrocannabinol: POSITIVE — AB

## 2024-02-23 LAB — CBG MONITORING, ED: Glucose-Capillary: 216 mg/dL — ABNORMAL HIGH (ref 70–99)

## 2024-02-23 LAB — POTASSIUM: Potassium: 3.3 mmol/L — ABNORMAL LOW (ref 3.5–5.1)

## 2024-02-23 LAB — CK: Total CK: 244 U/L (ref 49–397)

## 2024-02-23 MED ORDER — ADULT MULTIVITAMIN W/MINERALS CH
1.0000 | ORAL_TABLET | Freq: Every day | ORAL | Status: DC
  Administered 2024-02-23 – 2024-02-24 (×2): 1 via ORAL
  Filled 2024-02-23 (×2): qty 1

## 2024-02-23 MED ORDER — HYDRALAZINE HCL 50 MG PO TABS
50.0000 mg | ORAL_TABLET | Freq: Three times a day (TID) | ORAL | Status: DC
  Administered 2024-02-23 – 2024-02-24 (×4): 50 mg via ORAL
  Filled 2024-02-23 (×4): qty 1

## 2024-02-23 MED ORDER — BENZONATATE 100 MG PO CAPS
100.0000 mg | ORAL_CAPSULE | Freq: Three times a day (TID) | ORAL | Status: DC | PRN
Start: 2024-02-23 — End: 2024-02-24

## 2024-02-23 MED ORDER — MAGNESIUM SULFATE 2 GM/50ML IV SOLN
2.0000 g | Freq: Once | INTRAVENOUS | Status: AC
  Administered 2024-02-23: 2 g via INTRAVENOUS
  Filled 2024-02-23: qty 50

## 2024-02-23 MED ORDER — ONDANSETRON HCL 4 MG PO TABS: 4.0000 mg | ORAL_TABLET | Freq: Four times a day (QID) | ORAL | Status: DC | PRN

## 2024-02-23 MED ORDER — ACETAMINOPHEN 325 MG PO TABS
650.0000 mg | ORAL_TABLET | Freq: Four times a day (QID) | ORAL | Status: DC | PRN
  Administered 2024-02-23: 650 mg via ORAL
  Filled 2024-02-23: qty 2

## 2024-02-23 MED ORDER — ORAL CARE MOUTH RINSE: 15.0000 mL | OROMUCOSAL | Status: DC | PRN

## 2024-02-23 MED ORDER — LORAZEPAM 2 MG/ML IJ SOLN: 0.0000 mg | Freq: Two times a day (BID) | INTRAMUSCULAR | Status: DC

## 2024-02-23 MED ORDER — LORAZEPAM 1 MG PO TABS: 1.0000 mg | ORAL_TABLET | ORAL | Status: DC | PRN

## 2024-02-23 MED ORDER — IPRATROPIUM BROMIDE 0.02 % IN SOLN: 0.5000 mg | Freq: Four times a day (QID) | RESPIRATORY_TRACT | Status: DC | PRN

## 2024-02-23 MED ORDER — THIAMINE MONONITRATE 100 MG PO TABS
100.0000 mg | ORAL_TABLET | Freq: Every day | ORAL | Status: DC
  Administered 2024-02-23 – 2024-02-24 (×2): 100 mg via ORAL
  Filled 2024-02-23 (×2): qty 1

## 2024-02-23 MED ORDER — POTASSIUM CHLORIDE CRYS ER 20 MEQ PO TBCR
40.0000 meq | EXTENDED_RELEASE_TABLET | ORAL | Status: AC
  Administered 2024-02-23 (×3): 40 meq via ORAL
  Filled 2024-02-23 (×3): qty 2

## 2024-02-23 MED ORDER — AMOXICILLIN-POT CLAVULANATE 875-125 MG PO TABS
1.0000 | ORAL_TABLET | Freq: Two times a day (BID) | ORAL | Status: DC
  Administered 2024-02-23 – 2024-02-24 (×3): 1 via ORAL
  Filled 2024-02-23 (×3): qty 1

## 2024-02-23 MED ORDER — LORAZEPAM 2 MG/ML IJ SOLN: 0.0000 mg | Freq: Four times a day (QID) | INTRAMUSCULAR | Status: DC

## 2024-02-23 MED ORDER — PANTOPRAZOLE SODIUM 40 MG PO TBEC
40.0000 mg | DELAYED_RELEASE_TABLET | Freq: Every day | ORAL | Status: DC
  Administered 2024-02-23 – 2024-02-24 (×2): 40 mg via ORAL
  Filled 2024-02-23 (×2): qty 1

## 2024-02-23 MED ORDER — GUAIFENESIN ER 600 MG PO TB12
1200.0000 mg | ORAL_TABLET | Freq: Two times a day (BID) | ORAL | Status: DC
  Administered 2024-02-23 – 2024-02-24 (×3): 1200 mg via ORAL
  Filled 2024-02-23 (×3): qty 2

## 2024-02-23 MED ORDER — ONDANSETRON HCL 4 MG/2ML IJ SOLN
4.0000 mg | Freq: Four times a day (QID) | INTRAMUSCULAR | Status: DC | PRN
Start: 2024-02-23 — End: 2024-02-24

## 2024-02-23 MED ORDER — ENOXAPARIN SODIUM 40 MG/0.4ML IJ SOSY
40.0000 mg | PREFILLED_SYRINGE | INTRAMUSCULAR | Status: DC
  Administered 2024-02-23 – 2024-02-24 (×2): 40 mg via SUBCUTANEOUS
  Filled 2024-02-23 (×2): qty 0.4

## 2024-02-23 MED ORDER — POTASSIUM CHLORIDE 10 MEQ/100ML IV SOLN
10.0000 meq | INTRAVENOUS | Status: AC
  Administered 2024-02-23 (×2): 10 meq via INTRAVENOUS
  Filled 2024-02-23 (×2): qty 100

## 2024-02-23 MED ORDER — FOLIC ACID 1 MG PO TABS
1.0000 mg | ORAL_TABLET | Freq: Every day | ORAL | Status: DC
  Administered 2024-02-23 – 2024-02-24 (×2): 1 mg via ORAL
  Filled 2024-02-23 (×2): qty 1

## 2024-02-23 MED ORDER — LABETALOL HCL 5 MG/ML IV SOLN: 10.0000 mg | Freq: Four times a day (QID) | INTRAVENOUS | Status: DC | PRN

## 2024-02-23 MED ORDER — ACETAMINOPHEN 650 MG RE SUPP: 650.0000 mg | Freq: Four times a day (QID) | RECTAL | Status: DC | PRN

## 2024-02-23 MED ORDER — LORAZEPAM 2 MG/ML IJ SOLN: 1.0000 mg | INTRAMUSCULAR | Status: DC | PRN

## 2024-02-23 MED ORDER — LORAZEPAM 1 MG PO TABS
1.0000 mg | ORAL_TABLET | Freq: Two times a day (BID) | ORAL | Status: AC
  Administered 2024-02-23 – 2024-02-24 (×3): 1 mg via ORAL
  Filled 2024-02-23 (×3): qty 1

## 2024-02-23 MED ORDER — IRBESARTAN 150 MG PO TABS
300.0000 mg | ORAL_TABLET | Freq: Every day | ORAL | Status: DC
  Administered 2024-02-23 – 2024-02-24 (×2): 300 mg via ORAL
  Filled 2024-02-23: qty 2
  Filled 2024-02-23: qty 1
  Filled 2024-02-23: qty 2

## 2024-02-23 MED ORDER — ASPIRIN 325 MG PO TBEC
325.0000 mg | DELAYED_RELEASE_TABLET | Freq: Every day | ORAL | Status: DC
  Administered 2024-02-24: 325 mg via ORAL
  Filled 2024-02-23: qty 1

## 2024-02-23 MED ORDER — THIAMINE HCL 100 MG/ML IJ SOLN: 100.0000 mg | Freq: Every day | INTRAMUSCULAR | Status: DC

## 2024-02-23 MED ORDER — AMLODIPINE BESYLATE 10 MG PO TABS
10.0000 mg | ORAL_TABLET | Freq: Every day | ORAL | Status: DC
  Administered 2024-02-23 – 2024-02-24 (×2): 10 mg via ORAL
  Filled 2024-02-23 (×2): qty 1
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
## 2024-02-23 NOTE — ED Notes (Signed)
 Patient informed that urine sample is needed. Toileting offered. Patient states unable to void at this time. Urinal placed at bedside.
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
## 2024-02-23 NOTE — Progress Notes (Signed)
 ICM consulted for substance abuse resources. Resources provided and attached to AVS. No further ICM needs.

## 2024-02-23 NOTE — Progress Notes (Signed)
 EEG complete - results pending

## 2024-02-23 NOTE — Procedures (Signed)
 Patient Name: AZHAR KNOPE  MRN: [id number]  Epilepsy Attending: Arlin MALVA Krebs  Referring Physician/Provider: Laurita Cort DASEN, MD  Date: 02/23/2024 Duration: 23 mins  Patient history: 58yo M with alcohol withdrawal seizure and ams. EG to evaluate for seizure  Level of alertness: Awake, asleep  AEDs during EEG study: Ativan   Technical aspects: This EEG study was done with scalp electrodes positioned according to the 10-20 International system of electrode placement. Electrical activity was reviewed with band pass filter of 1-70Hz , sensitivity of 7 uV/mm, display speed of 58mm/sec with a 60Hz  notched filter applied as appropriate. EEG data were recorded continuously and digitally stored.  Video monitoring was available and reviewed as appropriate.  Description: The posterior dominant rhythm consists of 9-10 Hz activity of moderate voltage (25-35 uV) seen predominantly in posterior head regions, symmetric and reactive to eye opening and eye closing. Sleep was characterized by vertex waves, sleep spindles (12 to 14 Hz), maximal frontocentral region. Hyperventilation and photic stimulation were not performed.      IMPRESSION: This study is within normal limits. No seizures or epileptiform discharges were seen throughout the recording.  A normal interictal EEG does not exclude the diagnosis of epilepsy.   Emile Ringgenberg O Ezrah Panning

## 2024-02-23 NOTE — Plan of Care (Signed)

## 2024-02-24 ENCOUNTER — Other Ambulatory Visit (HOSPITAL_COMMUNITY): Payer: Self-pay

## 2024-02-24 DIAGNOSIS — R569 Unspecified convulsions: Secondary | ICD-10-CM | POA: Diagnosis not present

## 2024-02-24 LAB — GASTROINTESTINAL PANEL BY PCR, STOOL (REPLACES STOOL CULTURE)

## 2024-02-24 LAB — BASIC METABOLIC PANEL WITH GFR
Anion gap: 11 (ref 5–15)
Anion gap: 12 (ref 5–15)
BUN: 9 mg/dL (ref 6–20)
BUN: 9 mg/dL (ref 6–20)
CO2: 27 mmol/L (ref 22–32)
CO2: 24 mmol/L (ref 22–32)
Calcium: 9.1 mg/dL (ref 8.9–10.3)
Calcium: 9 mg/dL (ref 8.9–10.3)
Chloride: 99 mmol/L (ref 98–111)
Chloride: 99 mmol/L (ref 98–111)
Creatinine, Ser: 0.89 mg/dL (ref 0.61–1.24)
Creatinine, Ser: 0.85 mg/dL (ref 0.61–1.24)
GFR, Estimated: >60 mL/min (ref >60)
GFR, Estimated: >60 mL/min (ref >60)
Glucose, Bld: 110 mg/dL — ABNORMAL HIGH (ref 70–99)
Glucose, Bld: 126 mg/dL — ABNORMAL HIGH (ref 70–99)
Potassium: 2.8 mmol/L — ABNORMAL LOW (ref 3.5–5.1)
Potassium: 2.9 mmol/L — ABNORMAL LOW (ref 3.5–5.1)
Sodium: 137 mmol/L (ref 135–145)
Sodium: 135 mmol/L (ref 135–145)

## 2024-02-24 LAB — CBC
HCT: 37.5 % — ABNORMAL LOW (ref 39.0–52.0)
Hemoglobin: 13.3 g/dL (ref 13.0–17.0)
MCH: 33.8 pg (ref 26.0–34.0)
MCHC: 35.5 g/dL (ref 30.0–36.0)
MCV: 95.4 fL (ref 80.0–100.0)
Platelets: 94 K/uL — ABNORMAL LOW (ref 150–400)
RBC: 3.93 MIL/uL — ABNORMAL LOW (ref 4.22–5.81)
RDW: 13.2 % (ref 11.5–15.5)
WBC: 7.8 K/uL (ref 4.0–10.5)
nRBC: 0 % (ref 0.0–0.2)

## 2024-02-24 LAB — MAGNESIUM: Magnesium: 1.8 mg/dL (ref 1.7–2.4)

## 2024-02-24 MED ORDER — POTASSIUM CHLORIDE CRYS ER 20 MEQ PO TBCR
20.0000 meq | EXTENDED_RELEASE_TABLET | Freq: Two times a day (BID) | ORAL | 0 refills | Status: DC
  Filled 2024-02-24: qty 4, 2d supply, fill #0

## 2024-02-24 MED ORDER — POTASSIUM CHLORIDE 20 MEQ PO PACK: 60.0000 meq | PACK | Freq: Once | ORAL | Status: DC

## 2024-02-24 MED ORDER — HYDRALAZINE HCL 50 MG PO TABS
50.0000 mg | ORAL_TABLET | Freq: Three times a day (TID) | ORAL | 0 refills | Status: DC
  Filled 2024-02-24: qty 90, 30d supply, fill #0

## 2024-02-24 MED ORDER — MAGNESIUM OXIDE -MG SUPPLEMENT 400 (240 MG) MG PO TABS
800.0000 mg | ORAL_TABLET | Freq: Every day | ORAL | Status: DC
  Administered 2024-02-24: 800 mg via ORAL
  Filled 2024-02-24: qty 2

## 2024-02-24 MED ORDER — AMOXICILLIN-POT CLAVULANATE 875-125 MG PO TABS
1.0000 | ORAL_TABLET | Freq: Two times a day (BID) | ORAL | 0 refills | Status: DC
  Filled 2024-02-24: qty 8, 4d supply, fill #0

## 2024-02-24 MED ORDER — POTASSIUM CHLORIDE 10 MEQ/100ML IV SOLN
10.0000 meq | INTRAVENOUS | Status: AC
  Administered 2024-02-24 (×4): 10 meq via INTRAVENOUS
  Filled 2024-02-24 (×3): qty 100

## 2024-02-24 MED ORDER — PHENOL 1.4 % MT LIQD
1.0000 | OROMUCOSAL | Status: DC | PRN
  Administered 2024-02-24: 1 via OROMUCOSAL
  Filled 2024-02-24: qty 177
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
## 2024-02-24 NOTE — Progress Notes (Signed)
 Discharge meds in a secure bag delivered to patient in room by this RN

## 2024-02-24 NOTE — Plan of Care (Signed)
   Problem: Education: Goal: Knowledge of General Education information will improve Description: Including pain rating scale, medication(s)/side effects and non-pharmacologic comfort measures Outcome: Progressing   Problem: Pain Managment: Goal: General experience of comfort will improve and/or be controlled Outcome: Progressing   Problem: Safety: Goal: Ability to remain free from injury will improve Outcome: Progressing

## 2024-02-24 NOTE — TOC Initial Note (Signed)
 Transition of Care [facility]) - Initial/Assessment Note    Patient Details  Name: Derrick Watson MRN: [id number] Date of Birth: [birth year]  Transition of Care [facility]) CM/SW Contact:    Heather DELENA Saltness, LCSW Phone Number: 02/24/2024, 9:54 AM  Clinical Narrative:                 Pt admitted to hospital due to seizure at home. Pt resides in an apartment with significant other. Pt has significant history of alcohol abuse, consuming several shots of hard liquor everyday. Per chart review, ED CSW was consulted and SUD counseling/education resources were placed in AVS. TOC will continue to follow for additional needs.    Expected Discharge Plan: Home/Self Care Barriers to Discharge: Continued Medical Work up   Patient Goals and CMS Choice Patient states their goals for this hospitalization and ongoing recovery are:: To return home        Expected Discharge Plan and Services In-house Referral: Clinical Social Work Discharge Planning Services: NA Post Acute Care Choice: NA Living arrangements for the past 2 months: Apartment                 DME Arranged: N/A DME Agency: NA       HH Arranged: NA HH Agency: NA        Prior Living Arrangements/Services Living arrangements for the past 2 months: Apartment Lives with:: Spouse Patient language and need for interpreter reviewed:: Yes Do you feel safe going back to the place where you live?: Yes      Need for Family Participation in Patient Care: Yes (Comment) Care giver support system in place?: Yes (comment)   Criminal Activity/Legal Involvement Pertinent to Current Situation/Hospitalization: No - Comment as needed  Activities of Daily Living   ADL Screening (condition at time of admission) Independently performs ADLs?: Yes (appropriate for developmental age) Is the patient deaf or have difficulty hearing?: No Does the patient have difficulty seeing, even when wearing glasses/contacts?: No Does the patient have difficulty  concentrating, remembering, or making decisions?: No  Permission Sought/Granted Permission sought to share information with : Family Supports Permission granted to share information with : Yes, Verbal Permission Granted  Share Information with NAME: Rosaline Marina     Permission granted to share info w Relationship: Significant other  Permission granted to share info w Contact Information: [phone]  Emotional Assessment Appearance:: Appears stated age Attitude/Demeanor/Rapport: Unable to Assess Affect (typically observed): Unable to Assess Orientation: : Oriented to Self, Oriented to Place, Oriented to  Time, Oriented to Situation Alcohol / Substance Use: Alcohol Use Psych Involvement: No (comment)  Admission diagnosis:  Hypokalemia [E87.6] Hypomagnesemia [E83.42] Seizure (HCC) [R56.9] Patient Active Problem List   Diagnosis Date Noted   Seizure due to alcohol withdrawal (HCC) 02/23/2024   Seizure (HCC) 02/23/2024   Closed fracture of fifth cervical vertebra (HCC) 01/21/2024   Chronic viral hepatitis B 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) 01/21/2024   Chronic obstructive pulmonary disease (HCC) 01/21/2024   Abnormal computerized axial tomography of abdomen 01/21/2024   Encounter to establish care 01/21/2024   Other chronic pain 01/21/2024   Chronic neck pain 01/21/2024   H/O ETOH abuse 01/21/2024   Hypertensive urgency 04/29/2023   Alcohol abuse 04/29/2023   Tobacco abuse 04/29/2023   Cervical radiculopathy 05/26/2022   Hyperlipidemia 06/05/2021   Cervical spondylosis 11/25/2019   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   Headache 08/18/2019   Cocaine abuse (HCC) 01/09/2014   Chest pain 01/08/2014   Hypertensive disorder 01/08/2014   Leukocytosis  01/08/2014   PCP:  Wheeler Harlene CROME, NP Pharmacy:   [facility] [id number] - [address] [place] - [address] [place] [address] Phone: [phone] Fax: [phone]  [id number] [address] [place], [place] - [address] AT [facility] OF [address] [place] [address] Phone: [phone] Fax: [phone]  [place] [facility] [address] [place] [address] Phone: [phone] Fax: [phone]   Social Drivers of Health (SDOH) Social History: SDOH Screenings   Food Insecurity: Food Insecurity Present (02/24/2024)  Housing: Low Risk  (02/24/2024)  Transportation Needs: No Transportation Needs (02/24/2024)  Utilities: Not At Risk (02/24/2024)  Depression (PHQ2-9): Medium Risk (01/21/2024)  Tobacco Use: High Risk (01/21/2024)   SDOH Interventions: None     Readmission Risk Interventions     No data to display          Signed: Heather Saltness, MSW, LCSW Clinical Social Worker Inpatient Care Management 02/24/2024 9:57 AM
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
## 2024-02-24 NOTE — Progress Notes (Signed)
 Consultation Progress Note   Patient: Derrick Watson FMW:[id number] DOB: [birth year] DOA: 02/23/2024 DOS: the patient was seen and examined on 02/24/2024 Primary service: Chareese Sergent, Landon BRAVO, MD  Brief hospital course: Derrick Watson is a 58 y.o. male with medical history significant of alcohol abuse, refractory HTN, HLD, alcohol withdrawal seizure, presented with seizure.  Has a known history of alcohol abuse and endorses drinking several shots of liquor daily. patient decided stop drinking for better blood pressure control and subsequently had diarrhea abdominal cramping and wife found him with tonic-clonic seizure movements.  EMS was called and patient was taken to the ER.  Noted to be hypomagnesemic, hypokalemic with elevated blood pressure.  CT head and neck was negative for acute fractures.  Assessment and Plan: Seizure Acute metabolic encephalopathy - Seizure secondary to alcohol withdrawal, probably triggered by severe hypokalemia and severe hypomagnesemia. -Started on CIWA protocol. - EEG showed no of seizures. - Currently patient has no signs of alcohol withdrawals. - Patient has signs of active alcohol withdrawal, will start him on twice daily Ativan  p.o. x 3 doses. - Routine EEG - Other DDx, given the history of onset of seizure closely related to a time he stopped drinking 48 hours ago as well as a concurrent severe hypomagnesemia and hypokalemia, alcohol withdrawal seizure most likely and other etiology for seizure less likely.  Check UDS.   Hypokalemia - IV and p.o. replacement, sodium at 2.8, ordered 40 mEq of IV potassium. Recheck levels at 1400   Hypomagnesemia - IV replacement, recheck magnesium of 1.8. - P.o. magnesium replacement.   Acute enteritis resolved - Appears to be self-limiting, abdominal exam benign, will check GI pathogen panel - Consider Imodium if no infectious etiology found   Alcohol withdrawal Alcohol abuse -Continue CIWA protocol   Cough - Check  chest x-ray, clinically suspect aspiration during seizure - Start Augmentin - As needed Atrovent   HTN - Hold off HCTZ due to severe hypokalemia - BP borderline low, will start as needed Labetalol     DVT prophylaxis: Lovenox  Code Status: Full code       [facility] will continue to follow the patient.  Subjective: Patient seen at bedside this morning, he is requesting to go home stating that he started a new job and does not want to lose his job.  Potassium is at 2.8, ordered IV replacement.  Levels were checked at 2 PM and if within normal limits patient could be discharged given that he has not had any further seizure activities and electrolytes are within normal limits.  Physical Exam: Vitals:   02/24/24 0200 02/24/24 0602 02/24/24 0604 02/24/24 1028  BP: 118/75 128/80 128/80 128/80  Pulse: 88 94  94  Resp:  20    Temp:  99 F (37.2 C)    TempSrc:      SpO2:  98%    Weight:      Height:       General  No acute Distress Eyes: PERRL, lids and conjunctivae normal ENMT: Mucous membranes are moist.   Neck: normal, supple, no masses, no thyromegaly Respiratory: clear to auscultation bilaterally, no wheezing, no crackles. Normal respiratory effort. No accessory muscle use.  Cardiovascular: Regular rate and rhythm, no murmurs / rubs / gallops Abdomen: Soft, nontender nondistended. Musculoskeletal: no clubbing / cyanosis. No joint deformity upper and lower extremities.  Skin: no rashes, lesions, ulcers. No induration Neurologic: Facial asymmetry, moving extremity spontaneously, speech fluent. Psychiatric: Normal judgment and insight. Alert and oriented x 3.  Normal mood.     Data Reviewed:    CBC    Component Value Date/Time   WBC 7.8 02/24/2024 0553   RBC 3.93 (L) 02/24/2024 0553   HGB 13.3 02/24/2024 0553   HCT 37.5 (L) 02/24/2024 0553   PLT 94 (L) 02/24/2024 0553   MCV 95.4 02/24/2024 0553   MCH 33.8 02/24/2024 0553   MCHC 35.5 02/24/2024 0553   RDW 13.2 02/24/2024  0553   LYMPHSABS 1.9 02/23/2024 0751   MONOABS 0.5 02/23/2024 0751   EOSABS 0.1 02/23/2024 0751   BASOSABS 0.0 02/23/2024 0751    CMP     Component Value Date/Time   NA 137 02/24/2024 0553   NA 143 01/15/2024 1010   K 2.8 (L) 02/24/2024 0553   CL 99 02/24/2024 0553   CO2 27 02/24/2024 0553   GLUCOSE 110 (H) 02/24/2024 0553   BUN 9 02/24/2024 0553   BUN 5 (L) 01/15/2024 1010   CREATININE 0.89 02/24/2024 0553   CALCIUM 9.1 02/24/2024 0553   PROT 7.9 02/23/2024 0751   PROT 7.6 07/05/2021 0814   ALBUMIN 4.4 02/23/2024 0751   ALBUMIN 4.5 07/05/2021 0814   AST 51 (H) 02/23/2024 0751   ALT 30 02/23/2024 0751   ALKPHOS 82 02/23/2024 0751   BILITOT 1.7 (H) 02/23/2024 0751   BILITOT 0.4 07/05/2021 0814   EGFR 107 01/15/2024 1010   GFRNONAA >60 02/24/2024 0553    Family Communication: None at bedside  Time spent: 34 minutes.  Author: Landon FORBES Baller, MD 02/24/2024 11:39 AM  For on call review [email or web address].

## 2024-02-24 NOTE — Plan of Care (Signed)
  Problem: Education: Goal: Knowledge of General Education information will improve Description: Including pain rating scale, medication(s)/side effects and non-pharmacologic comfort measures Outcome: Progressing   Problem: Clinical Measurements: Goal: Will remain free from infection Outcome: Progressing   Problem: Coping: Goal: Level of anxiety will decrease Outcome: Progressing   Problem: Pain Managment: Goal: General experience of comfort will improve and/or be controlled Outcome: Progressing

## 2024-02-24 NOTE — Plan of Care (Signed)
  Problem: Education: Goal: Knowledge of General Education information will improve Description: Including pain rating scale, medication(s)/side effects and non-pharmacologic comfort measures 02/24/2024 1608 by Angelie Kram C, RN Outcome: Adequate for Discharge 02/24/2024 1601 by Gordon Carolyn BROCKS, RN Outcome: Progressing   Problem: Health Behavior/Discharge Planning: Goal: Ability to manage health-related needs will improve 02/24/2024 1608 by Dene Nazir C, RN Outcome: Adequate for Discharge 02/24/2024 1601 by Gordon Carolyn BROCKS, RN Outcome: Progressing   Problem: Clinical Measurements: Goal: Ability to maintain clinical measurements within normal limits will improve 02/24/2024 1608 by Delsie Amador C, RN Outcome: Adequate for Discharge 02/24/2024 1601 by Gordon Carolyn BROCKS, RN Outcome: Progressing Goal: Will remain free from infection 02/24/2024 1608 by Agnes Brightbill C, RN Outcome: Adequate for Discharge 02/24/2024 1601 by Maddyx Wieck C, RN Outcome: Progressing Goal: Diagnostic test results will improve 02/24/2024 1608 by Nalini Alcaraz C, RN Outcome: Adequate for Discharge 02/24/2024 1601 by Joeangel Jeanpaul C, RN Outcome: Progressing Goal: Respiratory complications will improve 02/24/2024 1608 by Brennen Gardiner C, RN Outcome: Adequate for Discharge 02/24/2024 1601 by Giankarlo Leamer C, RN Outcome: Progressing Goal: Cardiovascular complication will be avoided 02/24/2024 1608 by Ziva Nunziata C, RN Outcome: Adequate for Discharge 02/24/2024 1601 by Dael Howland C, RN Outcome: Progressing   Problem: Activity: Goal: Risk for activity intolerance will decrease 02/24/2024 1608 by Earland Reish C, RN Outcome: Adequate for Discharge 02/24/2024 1601 by Gordon Carolyn BROCKS, RN Outcome: Progressing   Problem: Nutrition: Goal: Adequate nutrition will be maintained 02/24/2024 1608 by Lanett Lasorsa C, RN Outcome: Adequate for Discharge 02/24/2024 1601  by Gordon Carolyn BROCKS, RN Outcome: Progressing   Problem: Coping: Goal: Level of anxiety will decrease 02/24/2024 1608 by Laprecious Austill C, RN Outcome: Adequate for Discharge 02/24/2024 1601 by Gordon Carolyn BROCKS, RN Outcome: Progressing   Problem: Elimination: Goal: Will not experience complications related to bowel motility 02/24/2024 1608 by Koray Soter C, RN Outcome: Adequate for Discharge 02/24/2024 1601 by Boyce Keltner C, RN Outcome: Progressing Goal: Will not experience complications related to urinary retention 02/24/2024 1608 by Purity Irmen C, RN Outcome: Adequate for Discharge 02/24/2024 1601 by Latifa Noble C, RN Outcome: Progressing   Problem: Pain Managment: Goal: General experience of comfort will improve and/or be controlled 02/24/2024 1608 by Bisma Klett C, RN Outcome: Adequate for Discharge 02/24/2024 1601 by Brittnay Pigman C, RN Outcome: Progressing   Problem: Safety: Goal: Ability to remain free from injury will improve 02/24/2024 1608 by Polina Burmaster C, RN Outcome: Adequate for Discharge 02/24/2024 1601 by Gordon Carolyn BROCKS, RN Outcome: Progressing   Problem: Skin Integrity: Goal: Risk for impaired skin integrity will decrease 02/24/2024 1608 by Leshon Armistead C, RN Outcome: Adequate for Discharge 02/24/2024 1601 by Dishawn Bhargava C, RN Outcome: Progressing

## 2024-02-25 ENCOUNTER — Ambulatory Visit: Admitting: Student
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
## 2024-02-26 ENCOUNTER — Ambulatory Visit: Admitting: Student

## 2024-02-26 ENCOUNTER — Encounter: Payer: Self-pay | Admitting: Student

## 2024-02-26 VITALS — BP 148/89 | HR 88 | Temp 98.3°F | Resp 16 | Ht 70.0 in | Wt 166.2 lb

## 2024-02-26 DIAGNOSIS — E876 Hypokalemia: Secondary | ICD-10-CM

## 2024-02-26 DIAGNOSIS — Z720 Tobacco use: Secondary | ICD-10-CM | POA: Diagnosis not present

## 2024-02-26 DIAGNOSIS — F32A Depression, unspecified: Secondary | ICD-10-CM

## 2024-02-26 DIAGNOSIS — F1010 Alcohol abuse, uncomplicated: Secondary | ICD-10-CM

## 2024-02-26 DIAGNOSIS — E785 Hyperlipidemia, unspecified: Secondary | ICD-10-CM | POA: Diagnosis not present

## 2024-02-26 DIAGNOSIS — J449 Chronic obstructive pulmonary disease, unspecified: Secondary | ICD-10-CM

## 2024-02-26 DIAGNOSIS — I1A0 Resistant hypertension: Secondary | ICD-10-CM

## 2024-02-26 DIAGNOSIS — F419 Anxiety disorder, unspecified: Secondary | ICD-10-CM

## 2024-02-26 DIAGNOSIS — Z0000 Encounter for general adult medical examination without abnormal findings: Secondary | ICD-10-CM

## 2024-02-26 LAB — COMPREHENSIVE METABOLIC PANEL WITH GFR
ALT: 28 U/L (ref 0–53)
AST: 54 U/L — ABNORMAL HIGH (ref 0–37)
Albumin: 4.4 g/dL (ref 3.5–5.2)
Alkaline Phosphatase: 67 U/L (ref 39–117)
BUN: 11 mg/dL (ref 6–23)
CO2: 32 meq/L (ref 19–32)
Calcium: 9.8 mg/dL (ref 8.4–10.5)
Chloride: 98 meq/L (ref 96–112)
Creatinine, Ser: 0.68 mg/dL (ref 0.40–1.50)
GFR: 101.62 mL/min (ref >60.00)
Glucose, Bld: 88 mg/dL (ref 70–99)
Potassium: 3.5 meq/L (ref 3.5–5.1)
Sodium: 137 meq/L (ref 135–145)
Total Bilirubin: 0.6 mg/dL (ref 0.2–1.2)
Total Protein: 7.5 g/dL (ref 6.0–8.3)

## 2024-02-26 LAB — LIPID PANEL
Cholesterol: 231 mg/dL — ABNORMAL HIGH (ref 0–200)
HDL: 127.5 mg/dL (ref >39.00)
LDL Cholesterol: 84 mg/dL (ref 0–99)
NonHDL: 103.66
Total CHOL/HDL Ratio: 2
Triglycerides: 97 mg/dL (ref 0.0–149.0)
VLDL: 19.4 mg/dL (ref 0.0–40.0)

## 2024-02-26 MED ORDER — HYDRALAZINE HCL 50 MG PO TABS: 50.0000 mg | ORAL_TABLET | Freq: Three times a day (TID) | ORAL | 0 refills | Status: AC

## 2024-02-26 MED ORDER — AMLODIPINE-OLMESARTAN 10-40 MG PO TABS: 1.0000 | ORAL_TABLET | Freq: Every day | ORAL | 2 refills | Status: AC

## 2024-02-26 MED ORDER — TRELEGY ELLIPTA 200-62.5-25 MCG/ACT IN AEPB: 1.0000 | INHALATION_SPRAY | Freq: Every day | RESPIRATORY_TRACT | 3 refills | Status: AC

## 2024-02-26 NOTE — Assessment & Plan Note (Signed)
 Patient recently evaluated in the emergency department for alcohol withdrawal and has since discontinued alcohol use. Discussed supportive resources including counseling, behavioral health follow-up, and Alcoholics Anonymous (AA). Encouraged engagement with support groups or mental health services for ongoing support. Pt declines BH referral at this time.  Will continue to monitor progress at follow-up.

## 2024-02-26 NOTE — Assessment & Plan Note (Addendum)
 Denies recent exacerbations. COPD managed with Trelegy.  - Refilled Trelegy prescription. - Use albuterol  as needed for wheezing. Discuss LDCT lung screening at CPE

## 2024-02-26 NOTE — Assessment & Plan Note (Addendum)
 Not on statin, check lipid panel.

## 2024-02-26 NOTE — Assessment & Plan Note (Addendum)
 Hydrochlorothiazide  discontinued r/t hypokalemia. Recheck CMP today. Current Meds: Amlodipine -Olmesartan  10-40 (AZOR) daily, hydralazine  50 mg TID  Encouraged heart healthy diet such as the DASH diet and exercise as tolerated. Monitor BP at home, bring readings to FU visit. Encourage follow up with Cardiology-HTN clinic for resistant HTN.

## 2024-02-26 NOTE — Progress Notes (Signed)
 ERROR- PT NO SHOW TO VISIT

## 2024-02-26 NOTE — Patient Instructions (Addendum)
 Cardiology- Hypertension Clinic  [phone]

## 2024-02-26 NOTE — Assessment & Plan Note (Signed)
 Continued tobacco use with reduction from two packs per day to 4 cigarettes per day.  - Encouraged smoking cessation.

## 2024-02-26 NOTE — Assessment & Plan Note (Signed)
 Pt declines medications, counseling, or BH referral at this time.  Continue to monitor.

## 2024-02-27 ENCOUNTER — Ambulatory Visit: Payer: Self-pay | Admitting: Student

## 2024-02-27 LAB — HEPATITIS C ANTIBODY: Hepatitis C Ab: NONREACTIVE

## 2024-03-02 ENCOUNTER — Ambulatory Visit: Admitting: Student

## 2024-03-30 ENCOUNTER — Encounter (HOSPITAL_BASED_OUTPATIENT_CLINIC_OR_DEPARTMENT_OTHER): Payer: Self-pay | Admitting: Cardiovascular Disease

## 2024-03-30 ENCOUNTER — Ambulatory Visit (INDEPENDENT_AMBULATORY_CARE_PROVIDER_SITE_OTHER): Admitting: Cardiovascular Disease

## 2024-03-30 VITALS — BP 210/104 | HR 96 | Ht 70.0 in | Wt 155.0 lb

## 2024-03-30 DIAGNOSIS — I160 Hypertensive urgency: Secondary | ICD-10-CM

## 2024-03-30 DIAGNOSIS — F1010 Alcohol abuse, uncomplicated: Secondary | ICD-10-CM

## 2024-03-30 DIAGNOSIS — Z5181 Encounter for therapeutic drug level monitoring: Secondary | ICD-10-CM

## 2024-03-30 DIAGNOSIS — J449 Chronic obstructive pulmonary disease, unspecified: Secondary | ICD-10-CM | POA: Diagnosis not present

## 2024-03-30 DIAGNOSIS — Z720 Tobacco use: Secondary | ICD-10-CM

## 2024-03-30 MED ORDER — SPIRONOLACTONE 25 MG PO TABS: 25.0000 mg | ORAL_TABLET | Freq: Every day | ORAL | 1 refills | Status: AC
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
## 2024-03-30 NOTE — Patient Instructions (Addendum)
 Medication Instructions:  START ROSUVASTATIN 20 MG DAILY   START SPIRONOLACTONE  25 MG DAILY  Labwork: BMET/METANEPHRINES 1 WEEK   Testing/Procedures: Your physician has requested that you have a renal artery duplex. During this test, an ultrasound is used to evaluate blood flow to the kidneys. Allow one hour for this exam. Do not eat after midnight the day before and avoid carbonated beverages. Take your medications as you usually do.  Follow-Up: 04/28/2024 AT 11:40 WITH DR [place]   2 MONTHS WITH DR [facility]   You have been referred to PSYCHIATRY  Ambulatory referral to Psychiatry Where: [facility] Address: [address] [place] [address] Phone: [phone] IF YOU DO NOT HEAR FROM THEM BY TUESDAY PLEASE CALL THEM DIRECTLY TO FOLLOW UP   Any Other Special Instructions Will Be Listed Below (If Applicable). MONITOR YOUR BLOOD PRESSURE TWICE A DAY, LOG IN THE BOOK PROVIDED. BRING THE BOOK AND YOUR BLOOD PRESSURE MACHINE TO YOUR FOLLOW UP IN 1 MONTH   If you need a refill on your cardiac medications before your next appointment, please call your pharmacy.

## 2024-04-16 ENCOUNTER — Other Ambulatory Visit: Payer: Self-pay | Admitting: Student

## 2024-04-16 DIAGNOSIS — I10 Essential (primary) hypertension: Secondary | ICD-10-CM

## 2024-04-19 ENCOUNTER — Institutional Professional Consult (permissible substitution) (HOSPITAL_BASED_OUTPATIENT_CLINIC_OR_DEPARTMENT_OTHER): Admitting: Cardiovascular Disease

## 2024-04-28 ENCOUNTER — Ambulatory Visit (HOSPITAL_BASED_OUTPATIENT_CLINIC_OR_DEPARTMENT_OTHER): Admitting: Cardiovascular Disease
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
## 2024-05-02 ENCOUNTER — Encounter (HOSPITAL_BASED_OUTPATIENT_CLINIC_OR_DEPARTMENT_OTHER)

## 2024-05-06 ENCOUNTER — Encounter (HOSPITAL_BASED_OUTPATIENT_CLINIC_OR_DEPARTMENT_OTHER)

## 2024-05-09 ENCOUNTER — Encounter (HOSPITAL_BASED_OUTPATIENT_CLINIC_OR_DEPARTMENT_OTHER)

## 2024-05-31 ENCOUNTER — Ambulatory Visit: Admitting: Student

## 2024-06-02 ENCOUNTER — Encounter (HOSPITAL_BASED_OUTPATIENT_CLINIC_OR_DEPARTMENT_OTHER): Admitting: Family

## 2024-06-06 ENCOUNTER — Ambulatory Visit (HOSPITAL_COMMUNITY): Payer: Self-pay | Admitting: Psychiatry

## 2024-06-08 ENCOUNTER — Encounter (HOSPITAL_BASED_OUTPATIENT_CLINIC_OR_DEPARTMENT_OTHER)
